# Patient Record
Sex: Female | Born: 2000 | Race: White | Hispanic: No | Marital: Single | Smoking: Never smoker
Health system: Southern US, Community
[De-identification: ages and names within clinical notes are randomized; demographics above are authoritative.]

## PROBLEM LIST (undated history)

## (undated) DIAGNOSIS — K219 Gastro-esophageal reflux disease without esophagitis: Secondary | ICD-10-CM

## (undated) DIAGNOSIS — R519 Headache, unspecified: Secondary | ICD-10-CM

## (undated) HISTORY — PX: NO PAST SURGERIES: SHX2092

---
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
## 2020-01-05 ENCOUNTER — Other Ambulatory Visit: Payer: Medicaid Other

## 2020-01-12 ENCOUNTER — Ambulatory Visit (INDEPENDENT_AMBULATORY_CARE_PROVIDER_SITE_OTHER): Payer: Medicaid Other

## 2020-01-12 ENCOUNTER — Other Ambulatory Visit: Payer: Self-pay

## 2020-01-12 ENCOUNTER — Encounter: Payer: Self-pay | Admitting: Obstetrics & Gynecology

## 2020-01-12 ENCOUNTER — Ambulatory Visit (INDEPENDENT_AMBULATORY_CARE_PROVIDER_SITE_OTHER): Payer: Medicaid Other | Admitting: Obstetrics & Gynecology

## 2020-01-12 VITALS — BP 100/60 | Wt 158.0 lb

## 2020-01-12 DIAGNOSIS — Z3A21 21 weeks gestation of pregnancy: Secondary | ICD-10-CM

## 2020-01-12 DIAGNOSIS — Z3482 Encounter for supervision of other normal pregnancy, second trimester: Secondary | ICD-10-CM

## 2020-01-12 DIAGNOSIS — Z131 Encounter for screening for diabetes mellitus: Secondary | ICD-10-CM

## 2020-01-12 DIAGNOSIS — Z3A20 20 weeks gestation of pregnancy: Secondary | ICD-10-CM

## 2020-01-12 DIAGNOSIS — Z3480 Encounter for supervision of other normal pregnancy, unspecified trimester: Secondary | ICD-10-CM

## 2020-01-12 NOTE — Patient Instructions (Signed)
Thank you for choosing [facility]. As part of our ongoing efforts to improve patient experience, we would appreciate your feedback. Please fill out the short survey that you will receive by mail or MyChart. Your opinion is important to us! -Dr Felicite Zeimet  Second Trimester of Pregnancy The second trimester is from week 14 through week 27 (months 4 through 6). The second trimester is often a time when you feel your best. Your body has adjusted to being pregnant, and you begin to feel better physically. Usually, morning sickness has lessened or quit completely, you may have more energy, and you may have an increase in appetite. The second trimester is also a time when the fetus is growing rapidly. At the end of the sixth month, the fetus is about 9 inches long and weighs about 1 pounds. You will likely begin to feel the baby move (quickening) between 16 and 20 weeks of pregnancy. Body changes during your second trimester Your body continues to go through many changes during your second trimester. The changes vary from woman to woman.  Your weight will continue to increase. You will notice your lower abdomen bulging out.  You may begin to get stretch marks on your hips, abdomen, and breasts.  You may develop headaches that can be relieved by medicines. The medicines should be approved by your health care provider.  You may urinate more often because the fetus is pressing on your bladder.  You may develop or continue to have heartburn as a result of your pregnancy.  You may develop constipation because certain hormones are causing the muscles that push waste through your intestines to slow down.  You may develop hemorrhoids or swollen, bulging veins (varicose veins).  You may have back pain. This is caused by: ? Weight gain. ? Pregnancy hormones that are relaxing the joints in your pelvis. ? A shift in weight and the muscles that support your balance.  Your breasts will continue to grow and  they will continue to become tender.  Your gums may bleed and may be sensitive to brushing and flossing.  Dark spots or blotches (chloasma, mask of pregnancy) may develop on your face. This will likely fade after the baby is born.  A dark line from your belly button to the pubic area (linea nigra) may appear. This will likely fade after the baby is born.  You may have changes in your hair. These can include thickening of your hair, rapid growth, and changes in texture. Some women also have hair loss during or after pregnancy, or hair that feels dry or thin. Your hair will most likely return to normal after your baby is born. What to expect at prenatal visits During a routine prenatal visit:  You will be weighed to make sure you and the fetus are growing normally.  Your blood pressure will be taken.  Your abdomen will be measured to track your baby's growth.  The fetal heartbeat will be listened to.  Any test results from the previous visit will be discussed. Your health care provider may ask you:  How you are feeling.  If you are feeling the baby move.  If you have had any abnormal symptoms, such as leaking fluid, bleeding, severe headaches, or abdominal cramping.  If you are using any tobacco products, including cigarettes, chewing tobacco, and electronic cigarettes.  If you have any questions. Other tests that may be performed during your second trimester include:  Blood tests that check for: ? Low iron levels (  anemia). ? High blood sugar that affects pregnant women (gestational diabetes) between 24 and 28 weeks. ? Rh antibodies. This is to check for a protein on red blood cells (Rh factor).  Urine tests to check for infections, diabetes, or protein in the urine.  An ultrasound to confirm the proper growth and development of the baby.  An amniocentesis to check for possible genetic problems.  Fetal screens for spina bifida and Down syndrome.  HIV (human  immunodeficiency virus) testing. Routine prenatal testing includes screening for HIV, unless you choose not to have this test. Follow these instructions at home: Medicines  Follow your health care provider's instructions regarding medicine use. Specific medicines may be either safe or unsafe to take during pregnancy.  Take a prenatal vitamin that contains at least 600 micrograms (mcg) of folic acid.  If you develop constipation, try taking a stool softener if your health care provider approves. Eating and drinking   Eat a balanced diet that includes fresh fruits and vegetables, whole grains, good sources of protein such as meat, eggs, or tofu, and low-fat dairy. Your health care provider will help you determine the amount of weight gain that is right for you.  Avoid raw meat and uncooked cheese. These carry germs that can cause birth defects in the baby.  If you have low calcium intake from food, talk to your health care provider about whether you should take a daily calcium supplement.  Limit foods that are high in fat and processed sugars, such as fried and sweet foods.  To prevent constipation: ? Drink enough fluid to keep your urine clear or pale yellow. ? Eat foods that are high in fiber, such as fresh fruits and vegetables, whole grains, and beans. Activity  Exercise only as directed by your health care provider. Most women can continue their usual exercise routine during pregnancy. Try to exercise for 30 minutes at least 5 days a week. Stop exercising if you experience uterine contractions.  Avoid heavy lifting, wear low heel shoes, and practice good posture.  A sexual relationship may be continued unless your health care provider directs you otherwise. Relieving pain and discomfort  Wear a good support bra to prevent discomfort from breast tenderness.  Take warm sitz baths to soothe any pain or discomfort caused by hemorrhoids. Use hemorrhoid cream if your health care  provider approves.  Rest with your legs elevated if you have leg cramps or low back pain.  If you develop varicose veins, wear support hose. Elevate your feet for 15 minutes, 3-4 times a day. Limit salt in your diet. Prenatal Care  Write down your questions. Take them to your prenatal visits.  Keep all your prenatal visits as told by your health care provider. This is important. Safety  Wear your seat belt at all times when driving.  Make a list of emergency phone numbers, including numbers for family, friends, the hospital, and police and fire departments. General instructions  Ask your health care provider for a referral to a local prenatal education class. Begin classes no later than the beginning of month 6 of your pregnancy.  Ask for help if you have counseling or nutritional needs during pregnancy. Your health care provider can offer advice or refer you to specialists for help with various needs.  Do not use hot tubs, steam rooms, or saunas.  Do not douche or use tampons or scented sanitary pads.  Do not cross your legs for long periods of time.  Avoid cat litter   boxes and soil used by cats. These carry germs that can cause birth defects in the baby and possibly loss of the fetus by miscarriage or stillbirth.  Avoid all smoking, herbs, alcohol, and unprescribed drugs. Chemicals in these products can affect the formation and growth of the baby.  Do not use any products that contain nicotine or tobacco, such as cigarettes and e-cigarettes. If you need help quitting, ask your health care provider.  Visit your dentist if you have not gone yet during your pregnancy. Use a soft toothbrush to brush your teeth and be gentle when you floss. Contact a health care provider if:  You have dizziness.  You have mild pelvic cramps, pelvic pressure, or nagging pain in the abdominal area.  You have persistent nausea, vomiting, or diarrhea.  You have a bad smelling vaginal  discharge.  You have pain when you urinate. Get help right away if:  You have a fever.  You are leaking fluid from your vagina.  You have spotting or bleeding from your vagina.  You have severe abdominal cramping or pain.  You have rapid weight gain or weight loss.  You have shortness of breath with chest pain.  You notice sudden or extreme swelling of your face, hands, ankles, feet, or legs.  You have not felt your baby move in over an hour.  You have severe headaches that do not go away when you take medicine.  You have vision changes. Summary  The second trimester is from week 14 through week 27 (months 4 through 6). It is also a time when the fetus is growing rapidly.  Your body goes through many changes during pregnancy. The changes vary from woman to woman.  Avoid all smoking, herbs, alcohol, and unprescribed drugs. These chemicals affect the formation and growth your baby.  Do not use any tobacco products, such as cigarettes, chewing tobacco, and e-cigarettes. If you need help quitting, ask your health care provider.  Contact your health care provider if you have any questions. Keep all prenatal visits as told by your health care provider. This is important. This information is not intended to replace advice given to you by your health care provider. Make sure you discuss any questions you have with your health care provider. Document Revised: 07/05/2018 Document Reviewed: 04/18/2016 [employer]vier Patient Education  2020 [employer]  

## 2020-01-12 NOTE — Progress Notes (Signed)
  Subjective  Fetal Movement? yes Contractions? no Leaking Fluid? no Vaginal Bleeding? no  Objective  BP 100/60   Wt 158 lb (71.7 kg)   LMP 08/20/2019   BMI 23.33 kg/m  General: NAD Pumonary: no increased work of breathing Abdomen: gravid, non-tender Extremities: no edema Psychiatric: mood appropriate, affect full  Review of ULTRASOUND. I have personally reviewed images and report of recent ultrasound done at [facility]. There is a singleton gestation with subjectively normal amniotic fluid volume. The fetal biometry correlates with established dating. Detailed evaluation of the fetal anatomy was performed.The fetal anatomical survey appears within normal limits within the resolution of ultrasound as described above.  It must be noted that a normal ultrasound is unable to rule out fetal aneuploidy.     Assessment  19 y.o. G1P0 at [redacted]w[redacted]d by  05/26/2020, by Last Menstrual Period presenting for routine prenatal visit  Plan   Problem List Items Addressed This Visit      Other   Supervision of other normal pregnancy, antepartum    Other Visit Diagnoses    [redacted] weeks gestation of pregnancy    -  Primary   Screening for diabetes mellitus       Relevant Orders   28 Week RH+Panel      pregnancy 1 Problems (from 11/03/19 to present)    Problem Noted Resolved   Supervision of other normal pregnancy, antepartum 10/16/2019 by Tresea Mall, CNM No   Overview Addendum 11/24/2019  8:20 AM by Natale Milch, MD    [facility] Prenatal Labs  Dating  LMP = 10 wk [place] Blood type:   O+  Genetic Screen NIPS: XY  Inheritest negative Antibody: negative  Anatomic [place]  Rubella: Immune Varicella:NI  GTT Early: NA               Third trimester:  RPR:   non Reactive  Rhogam  not needed HBsAg: negative  Vaccines TDAP:                       Flu Shot: HIV: negative  Baby Food Breast                    GBS:   GC/CT:  Contraception  Pap:NA (age)  CBB  no   CS/VBAC N/A   Support Person  Husband          Previous Version     PNV  Annamarie Major, MD, FACOG [facility], [facility] 01/12/2020  11:37 AM

## 2020-01-22 ENCOUNTER — Ambulatory Visit (INDEPENDENT_AMBULATORY_CARE_PROVIDER_SITE_OTHER): Payer: Medicaid Other

## 2020-01-22 ENCOUNTER — Other Ambulatory Visit: Payer: Self-pay

## 2020-01-22 DIAGNOSIS — R55 Syncope and collapse: Secondary | ICD-10-CM

## 2020-01-22 LAB — ECHOCARDIOGRAM COMPLETE
AR max vel: 2.96 cm2
AV Area VTI: 2.99 cm2
AV Area mean vel: 2.88 cm2
AV Mean grad: 3 mmHg
AV Peak grad: 5.6 mmHg
Ao pk vel: 1.18 m/s
Area-P 1/2: 3.4 cm2
S' Lateral: 3 cm
Single Plane A4C EF: 54.9 %

## 2020-01-26 ENCOUNTER — Ambulatory Visit: Payer: Medicaid Other | Admitting: Cardiology

## 2020-01-26 ENCOUNTER — Telehealth: Payer: Self-pay

## 2020-01-26 NOTE — Telephone Encounter (Signed)
Pt calling about labs ordered for [facility].  [phone]  [facility].

## 2020-01-26 NOTE — Telephone Encounter (Signed)
Questions answered.  Future labs to be drawn here in the office.

## 2020-02-09 ENCOUNTER — Other Ambulatory Visit: Payer: Self-pay

## 2020-02-09 ENCOUNTER — Ambulatory Visit (INDEPENDENT_AMBULATORY_CARE_PROVIDER_SITE_OTHER): Payer: Medicaid Other | Admitting: Obstetrics

## 2020-02-09 VITALS — BP 100/70 | Wt 162.0 lb

## 2020-02-09 DIAGNOSIS — Z3A24 24 weeks gestation of pregnancy: Secondary | ICD-10-CM

## 2020-02-09 DIAGNOSIS — G44329 Chronic post-traumatic headache, not intractable: Secondary | ICD-10-CM

## 2020-02-09 DIAGNOSIS — Z3482 Encounter for supervision of other normal pregnancy, second trimester: Secondary | ICD-10-CM

## 2020-02-09 LAB — POCT URINALYSIS DIPSTICK OB
Glucose, UA: NEGATIVE
POC,PROTEIN,UA: NEGATIVE

## 2020-02-09 MED ORDER — BUTALBITAL-APAP-CAFFEINE 50-325-40 MG PO CAPS: 1.0000 | ORAL_CAPSULE | Freq: Four times a day (QID) | ORAL | 0 refills | Status: DC | PRN
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
## 2020-02-12 ENCOUNTER — Encounter: Payer: Self-pay | Admitting: Cardiology

## 2020-02-12 ENCOUNTER — Ambulatory Visit (INDEPENDENT_AMBULATORY_CARE_PROVIDER_SITE_OTHER): Payer: Medicaid Other | Admitting: Cardiology

## 2020-02-12 ENCOUNTER — Other Ambulatory Visit: Payer: Self-pay

## 2020-02-12 VITALS — BP 110/70 | HR 75 | Ht 69.0 in | Wt 163.0 lb

## 2020-02-12 DIAGNOSIS — R55 Syncope and collapse: Secondary | ICD-10-CM | POA: Diagnosis not present

## 2020-02-12 DIAGNOSIS — Z3490 Encounter for supervision of normal pregnancy, unspecified, unspecified trimester: Secondary | ICD-10-CM | POA: Diagnosis not present

## 2020-02-12 NOTE — Progress Notes (Signed)
Cardiology Office Note:    Date:  02/12/2020   ID:  Valerie Conner, DOB 09/12/2000, MRN [id number]  PCP:  Erick Colace, MD  [facility] Cardiologist:  Debbe Odea, MD  [facility] Electrophysiologist:  None   Referring MD: Erick Colace, MD   Chief Complaint  Patient presents with  . OTHER    F/u echo and zio no complaints today. Meds reviewed verbally with pt.    History of Present Illness:    Valerie Conner is a 19 y.o. female with no significant past medical history, currently [redacted] weeks pregnant who presents for follow-up.  She was last seen due to syncope.  States passing out typically during her menses ongoing for 3 years.  Tach monitor was placed to evaluate any significant arrhythmias, echocardiogram ordered to evaluate any significant structural abnormalities, especially in light of patient being pregnant.  Feels well today, denies chest pain, palpitations, shortness of breath.  Has no new concerns at this time.    Past Medical History:  Diagnosis Date  . GERD (gastroesophageal reflux disease)     History reviewed. No pertinent surgical history.  Current Medications: Current Meds  Medication Sig  . Butalbital-APAP-Caffeine 50-325-40 MG capsule Take 1 capsule by mouth every 6 (six) hours as needed for headache.  . Prenatal Vit-Fe Fumarate-FA (PRENATAL VITAMIN PO) Take by mouth.     Allergies:   Patient has no known allergies.   Social History   Socioeconomic History  . Marital status: Single    Spouse name: Not on file  . Number of children: Not on file  . Years of education: Not on file  . Highest education level: Not on file  Occupational History  . Not on file  Tobacco Use  . Smoking status: Never Smoker  . Smokeless tobacco: Never Used  Substance and Sexual Activity  . Alcohol use: No  . Drug use: Never  . Sexual activity: Yes    Birth control/protection: None  Other Topics Concern  . Not on file  Social History Narrative  . Not  on file   Social Determinants of Health   Financial Resource Strain:   . Difficulty of Paying Living Expenses: Not on file  Food Insecurity:   . Worried About Programme researcher, broadcasting/film/video in the Last Year: Not on file  . Ran Out of Food in the Last Year: Not on file  Transportation Needs:   . Lack of Transportation (Medical): Not on file  . Lack of Transportation (Non-Medical): Not on file  Physical Activity:   . Days of Exercise per Week: Not on file  . Minutes of Exercise per Session: Not on file  Stress:   . Feeling of Stress : Not on file  Social Connections:   . Frequency of Communication with Friends and Family: Not on file  . Frequency of Social Gatherings with Friends and Family: Not on file  . Attends Religious Services: Not on file  . Active Member of Clubs or Organizations: Not on file  . Attends Banker Meetings: Not on file  . Marital Status: Not on file     Family History: The patient's family history is not on file.  ROS:   Please see the history of present illness.     All other systems reviewed and are negative.  EKGs/Labs/Other Studies Reviewed:    The following studies were reviewed today:   EKG:  EKG not  ordered today.    Recent Labs: 10/27/2019: Hemoglobin 11.8; Platelets 298  11/24/2019: ALT 13; BUN 7; Creatinine, Ser 0.52; Potassium 4.3; Sodium 137  Recent Lipid Panel No results found for: CHOL, TRIG, HDL, CHOLHDL, VLDL, LDLCALC, LDLDIRECT  Physical Exam:    VS:  BP 110/70 (BP Location: Left Arm, Patient Position: Sitting, Cuff Size: Normal)   Pulse 75   Ht 5\' 9"  (1.753 m)   Wt 163 lb (73.9 kg)   LMP 08/20/2019   SpO2 99%   BMI 24.07 kg/m     Wt Readings from Last 3 Encounters:  02/12/20 163 lb (73.9 kg) (90 %, Z= 1.26)*  02/09/20 162 lb (73.5 kg) (89 %, Z= 1.24)*  01/12/20 158 lb (71.7 kg) (87 %, Z= 1.14)*   * Growth percentiles are based on CDC (Girls, 2-20 Years) data.     GEN:  Well nourished, well developed in no acute  distress HEENT: Normal NECK: No JVD; No carotid bruits LYMPHATICS: No lymphadenopathy CARDIAC: RRR, no murmurs, rubs, gallops RESPIRATORY:  Clear to auscultation without rales, wheezing or rhonchi  ABDOMEN: Soft, non-tender, non-distended MUSCULOSKELETAL:  No edema; No deformity  SKIN: Warm and dry NEUROLOGIC:  Alert and oriented x 3 PSYCHIATRIC:  Normal affect   ASSESSMENT:    1. Syncope, unspecified syncope type   2. Pregnancy, unspecified gestational age    PLAN:    In order of problems listed above:  1. Patient with symptoms consistent with vasovagal syncope, including prodromes of dizziness, nausea, blurry vision prior to passing out.  Previous orthostatic were negative for orthostasis.  Cardiac monitor with no significant arrhythmias.  Echocardiogram showed normal systolic and diastolic function.  Patient made aware of results and reassured.  Symptoms likely vasovagal. 2. Currently [redacted] weeks pregnant, continue prenatal vitamins as per her OB/GYN.  Keep appointments with her obstetrician.  Follow-up as needed  Total encounter time 35 minutes  Greater than 50% was spent in counseling and coordination of care with the patient   Medication Adjustments/Labs and Tests Ordered: Current medicines are reviewed at length with the patient today.  Concerns regarding medicines are outlined above.  Orders Placed This Encounter  Procedures  . EKG 12-Lead   No orders of the defined types were placed in this encounter.   Patient Instructions  Medication Instructions:  Your physician recommends that you continue on your current medications as directed. Please refer to the Current Medication list given to you today.  *If you need a refill on your cardiac medications before your next appointment, please call your pharmacy*   Lab Work: None Ordered If you have labs (blood work) drawn today and your tests are completely normal, you will receive your results only by: 01/14/20 MyChart Message  (if you have MyChart) OR . A paper copy in the mail If you have any lab test that is abnormal or we need to change your treatment, we will call you to review the results.   Testing/Procedures: None Ordered   Follow-Up: At [facility], you and your health needs are our priority.  As part of our continuing mission to provide you with exceptional heart care, we have created designated Provider Care Teams.  These Care Teams include your primary Cardiologist (physician) and Advanced Practice Providers (APPs -  Physician Assistants and Nurse Practitioners) who all work together to provide you with the care you need, when you need it.  We recommend signing up for the patient portal called "MyChart".  Sign up information is provided on this After Visit Summary.  MyChart is used to connect with patients for  Virtual Visits (Telemedicine).  Patients are able to view lab/test results, encounter notes, upcoming appointments, etc.  Non-urgent messages can be sent to your provider as well.   To learn more about what you can do with MyChart, go to [email or web address].    Your next appointment:   Follow up as needed    The format for your next appointment:   In Person  Provider:   Debbe Odea, MD   Other Instructions      Signed, Debbe Odea, MD  02/12/2020 12:48 PM    [facility]

## 2020-02-12 NOTE — Patient Instructions (Signed)

## 2020-02-18 ENCOUNTER — Ambulatory Visit: Payer: Medicaid Other | Admitting: Neurology

## 2020-02-18 ENCOUNTER — Encounter: Payer: Self-pay | Admitting: Neurology

## 2020-02-18 ENCOUNTER — Other Ambulatory Visit: Payer: Self-pay

## 2020-02-18 VITALS — BP 98/64 | HR 72 | Ht 69.0 in | Wt 163.0 lb

## 2020-02-18 DIAGNOSIS — R55 Syncope and collapse: Secondary | ICD-10-CM | POA: Diagnosis not present
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
## 2020-02-18 NOTE — Patient Instructions (Signed)
Good to meet you. Try the counterpressure maneuvers when you start feeling the warning symptoms. Increase water intake and avoid triggers. Follow-up as needed, call for any changes

## 2020-02-21 ENCOUNTER — Encounter: Payer: Self-pay | Admitting: Neurology

## 2020-03-08 ENCOUNTER — Ambulatory Visit (INDEPENDENT_AMBULATORY_CARE_PROVIDER_SITE_OTHER): Payer: Medicaid Other | Admitting: Obstetrics & Gynecology

## 2020-03-08 ENCOUNTER — Other Ambulatory Visit: Payer: Self-pay

## 2020-03-08 ENCOUNTER — Encounter: Payer: Self-pay | Admitting: Obstetrics & Gynecology

## 2020-03-08 ENCOUNTER — Other Ambulatory Visit: Payer: Medicaid Other

## 2020-03-08 VITALS — BP 100/70 | Wt 167.0 lb

## 2020-03-08 DIAGNOSIS — Z3482 Encounter for supervision of other normal pregnancy, second trimester: Secondary | ICD-10-CM

## 2020-03-08 DIAGNOSIS — Z3A28 28 weeks gestation of pregnancy: Secondary | ICD-10-CM

## 2020-03-08 DIAGNOSIS — Z3483 Encounter for supervision of other normal pregnancy, third trimester: Secondary | ICD-10-CM

## 2020-03-08 DIAGNOSIS — Z3A24 24 weeks gestation of pregnancy: Secondary | ICD-10-CM

## 2020-03-08 NOTE — Progress Notes (Signed)
  Subjective  Fetal Movement? yes Contractions? no Leaking Fluid? no Vaginal Bleeding? No No syncope  Objective  BP 100/70   Wt 167 lb (75.8 kg)   LMP 08/20/2019   BMI 24.66 kg/m  General: NAD Pumonary: no increased work of breathing Abdomen: gravid, non-tender Extremities: no edema Psychiatric: mood appropriate, affect full  Assessment  19 y.o. G1P0 at [redacted]w[redacted]d by  05/26/2020, by Last Menstrual Period presenting for routine prenatal visit  Plan    Problem List Items Addressed This Visit    Visit Diagnoses    [redacted] weeks gestation of pregnancy    -  Primary   Encounter for supervision of other normal pregnancy in third trimester          pregnancy 1 Problems (from 11/03/19 to present)    Problem Noted Resolved   Supervision of other normal pregnancy, antepartum 10/16/2019 by Tresea Mall, CNM No   Overview Addendum 11/24/2019  8:20 AM by Natale Milch, MD    [facility]inic [facility] Prenatal [facility]  Dating  LMP = 10 wk [place] Blood type:   O+  Genetic Screen NIPS: XY  Inheritest negative Antibody: negative  Anatomic [place]  Rubella: Immune Varicella:NI  GTT Early: NA               Third trimester:  RPR:   non Reactive  Rhogam  not needed HBsAg: negative  Vaccines TDAP:                       Flu Shot: HIV: negative  Baby Food unsure                       GBS:   GC/CT:  Contraception  Pap:NA (age)  CBB     CS/VBAC N/A   Support Person        PNV, FMC, PTL precautions discussed     Annamarie Major, MD, [facility], [facility] 03/08/2020  10:21 AM

## 2020-03-08 NOTE — Patient Instructions (Signed)
Third Trimester of Pregnancy The third trimester is from week 28 through week 40 (months 7 through 9). The third trimester is a time when the unborn baby (fetus) is growing rapidly. At the end of the ninth month, the fetus is about 20 inches in length and weighs 6-10 pounds. Body changes during your third trimester Your body will continue to go through many changes during pregnancy. The changes vary from woman to woman. During the third trimester:  Your weight will continue to increase. You can expect to gain 25-35 pounds (11-16 kg) by the end of the pregnancy.  You may begin to get stretch marks on your hips, abdomen, and breasts.  You may urinate more often because the fetus is moving lower into your pelvis and pressing on your bladder.  You may develop or continue to have heartburn. This is caused by increased hormones that slow down muscles in the digestive tract.  You may develop or continue to have constipation because increased hormones slow digestion and cause the muscles that push waste through your intestines to relax.  You may develop hemorrhoids. These are swollen veins (varicose veins) in the rectum that can itch or be painful.  You may develop swollen, bulging veins (varicose veins) in your legs.  You may have increased body aches in the pelvis, back, or thighs. This is due to weight gain and increased hormones that are relaxing your joints.  You may have changes in your hair. These can include thickening of your hair, rapid growth, and changes in texture. Some women also have hair loss during or after pregnancy, or hair that feels dry or thin. Your hair will most likely return to normal after your baby is born.  Your breasts will continue to grow and they will continue to become tender. A yellow fluid (colostrum) may leak from your breasts. This is the first milk you are producing for your baby.  Your belly button may stick out.  You may notice more swelling in your hands,  face, or ankles.  You may have increased tingling or numbness in your hands, arms, and legs. The skin on your belly may also feel numb.  You may feel short of breath because of your expanding uterus.  You may have more problems sleeping. This can be caused by the size of your belly, increased need to urinate, and an increase in your body's metabolism.  You may notice the fetus "dropping," or moving lower in your abdomen (lightening).  You may have increased vaginal discharge.  You may notice your joints feel loose and you may have pain around your pelvic bone. What to expect at prenatal visits You will have prenatal exams every 2 weeks until week 36. Then you will have weekly prenatal exams. During a routine prenatal visit:  You will be weighed to make sure you and the baby are growing normally.  Your blood pressure will be taken.  Your abdomen will be measured to track your baby's growth.  The fetal heartbeat will be listened to.  Any test results from the previous visit will be discussed.  You may have a cervical check near your due date to see if your cervix has softened or thinned (effaced).  You will be tested for Group B streptococcus. This happens between 35 and 37 weeks. Your health care provider may ask you:  What your birth plan is.  How you are feeling.  If you are feeling the baby move.  If you have had any abnormal   symptoms, such as leaking fluid, bleeding, severe headaches, or abdominal cramping.  If you are using any tobacco products, including cigarettes, chewing tobacco, and electronic cigarettes.  If you have any questions. Other tests or screenings that may be performed during your third trimester include:  Blood tests that check for low iron levels (anemia).  Fetal testing to check the health, activity level, and growth of the fetus. Testing is done if you have certain medical conditions or if there are problems during the pregnancy.  Nonstress test  (NST). This test checks the health of your baby to make sure there are no signs of problems, such as the baby not getting enough oxygen. During this test, a belt is placed around your belly. The baby is made to move, and its heart rate is monitored during movement. What is false labor? False labor is a condition in which you feel small, irregular tightenings of the muscles in the womb (contractions) that usually go away with rest, changing position, or drinking water. These are called Braxton Hicks contractions. Contractions may last for hours, days, or even weeks before true labor sets in. If contractions come at regular intervals, become more frequent, increase in intensity, or become painful, you should see your health care provider. What are the signs of labor?  Abdominal cramps.  Regular contractions that start at 10 minutes apart and become stronger and more frequent with time.  Contractions that start on the top of the uterus and spread down to the lower abdomen and back.  Increased pelvic pressure and dull back pain.  A watery or bloody mucus discharge that comes from the vagina.  Leaking of amniotic fluid. This is also known as your "water breaking." It could be a slow trickle or a gush. Let your health care provider know if it has a color or strange odor. If you have any of these signs, call your health care provider right away, even if it is before your due date. Follow these instructions at home: Medicines  Follow your health care provider's instructions regarding medicine use. Specific medicines may be either safe or unsafe to take during pregnancy.  Take a prenatal vitamin that contains at least 600 micrograms (mcg) of folic acid.  If you develop constipation, try taking a stool softener if your health care provider approves. Eating and drinking   Eat a balanced diet that includes fresh fruits and vegetables, whole grains, good sources of protein such as meat, eggs, or tofu,  and low-fat dairy. Your health care provider will help you determine the amount of weight gain that is right for you.  Avoid raw meat and uncooked cheese. These carry germs that can cause birth defects in the baby.  If you have low calcium intake from food, talk to your health care provider about whether you should take a daily calcium supplement.  Eat four or five small meals rather than three large meals a day.  Limit foods that are high in fat and processed sugars, such as fried and sweet foods.  To prevent constipation: ? Drink enough fluid to keep your urine clear or pale yellow. ? Eat foods that are high in fiber, such as fresh fruits and vegetables, whole grains, and beans. Activity  Exercise only as directed by your health care provider. Most women can continue their usual exercise routine during pregnancy. Try to exercise for 30 minutes at least 5 days a week. Stop exercising if you experience uterine contractions.  Avoid heavy lifting.  Do   not exercise in extreme heat or humidity, or at high altitudes.  Wear low-heel, comfortable shoes.  Practice good posture.  You may continue to have sex unless your health care provider tells you otherwise. Relieving pain and discomfort  Take frequent breaks and rest with your legs elevated if you have leg cramps or low back pain.  Take warm sitz baths to soothe any pain or discomfort caused by hemorrhoids. Use hemorrhoid cream if your health care provider approves.  Wear a good support bra to prevent discomfort from breast tenderness.  If you develop varicose veins: ? Wear support pantyhose or compression stockings as told by your healthcare provider. ? Elevate your feet for 15 minutes, 3-4 times a day. Prenatal care  Write down your questions. Take them to your prenatal visits.  Keep all your prenatal visits as told by your health care provider. This is important. Safety  Wear your seat belt at all times when driving.  Make  a list of emergency phone numbers, including numbers for family, friends, the hospital, and police and fire departments. General instructions  Avoid cat litter boxes and soil used by cats. These carry germs that can cause birth defects in the baby. If you have a cat, ask someone to clean the litter box for you.  Do not travel far distances unless it is absolutely necessary and only with the approval of your health care provider.  Do not use hot tubs, steam rooms, or saunas.  Do not drink alcohol.  Do not use any products that contain nicotine or tobacco, such as cigarettes and e-cigarettes. If you need help quitting, ask your health care provider.  Do not use any medicinal herbs or unprescribed drugs. These chemicals affect the formation and growth of the baby.  Do not douche or use tampons or scented sanitary pads.  Do not cross your legs for long periods of time.  To prepare for the arrival of your baby: ? Take prenatal classes to understand, practice, and ask questions about labor and delivery. ? Make a trial run to the hospital. ? Visit the hospital and tour the maternity area. ? Arrange for maternity or paternity leave through employers. ? Arrange for family and friends to take care of pets while you are in the hospital. ? Purchase a rear-facing car seat and make sure you know how to install it in your car. ? Pack your hospital bag. ? Prepare the baby's nursery. Make sure to remove all pillows and stuffed animals from the baby's crib to prevent suffocation.  Visit your dentist if you have not gone during your pregnancy. Use a soft toothbrush to brush your teeth and be gentle when you floss. Contact a health care provider if:  You are unsure if you are in labor or if your water has broken.  You become dizzy.  You have mild pelvic cramps, pelvic pressure, or nagging pain in your abdominal area.  You have lower back pain.  You have persistent nausea, vomiting, or  diarrhea.  You have an unusual or bad smelling vaginal discharge.  You have pain when you urinate. Get help right away if:  Your water breaks before 37 weeks.  You have regular contractions less than 5 minutes apart before 37 weeks.  You have a fever.  You are leaking fluid from your vagina.  You have spotting or bleeding from your vagina.  You have severe abdominal pain or cramping.  You have rapid weight loss or weight gain.  You have   shortness of breath with chest pain.  You notice sudden or extreme swelling of your face, hands, ankles, feet, or legs.  Your baby makes fewer than 10 movements in 2 hours.  You have severe headaches that do not go away when you take medicine.  You have vision changes. Summary  The third trimester is from week 28 through week 40, months 7 through 9. The third trimester is a time when the unborn baby (fetus) is growing rapidly.  During the third trimester, your discomfort may increase as you and your baby continue to gain weight. You may have abdominal, leg, and back pain, sleeping problems, and an increased need to urinate.  During the third trimester your breasts will keep growing and they will continue to become tender. A yellow fluid (colostrum) may leak from your breasts. This is the first milk you are producing for your baby.  False labor is a condition in which you feel small, irregular tightenings of the muscles in the womb (contractions) that eventually go away. These are called Braxton Hicks contractions. Contractions may last for hours, days, or even weeks before true labor sets in.  Signs of labor can include: abdominal cramps; regular contractions that start at 10 minutes apart and become stronger and more frequent with time; watery or bloody mucus discharge that comes from the vagina; increased pelvic pressure and dull back pain; and leaking of amniotic fluid. This information is not intended to replace advice given to you by your  health care provider. Make sure you discuss any questions you have with your health care provider. Document Revised: 07/04/2018 Document Reviewed: 04/18/2016 Elsevier Patient Education  2020 [employer]  

## 2020-03-09 LAB — GLUCOSE, 1 HOUR GESTATIONAL: Gestational Diabetes Screen: 121 mg/dL (ref 65–139)

## 2020-03-22 ENCOUNTER — Ambulatory Visit (INDEPENDENT_AMBULATORY_CARE_PROVIDER_SITE_OTHER): Payer: Medicaid Other | Admitting: Obstetrics

## 2020-03-22 ENCOUNTER — Other Ambulatory Visit: Payer: Self-pay

## 2020-03-22 VITALS — BP 100/70 | Wt 169.0 lb

## 2020-03-22 DIAGNOSIS — Z3A30 30 weeks gestation of pregnancy: Secondary | ICD-10-CM

## 2020-03-22 DIAGNOSIS — Z3483 Encounter for supervision of other normal pregnancy, third trimester: Secondary | ICD-10-CM

## 2020-03-22 NOTE — Progress Notes (Signed)
  Routine Prenatal Care Visit  Subjective  Valerie Conner is a 19 y.o. G1P0 at [redacted]w[redacted]d being seen today for ongoing prenatal care.  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  ----------------------------------------------------------------------------------- Patient reports no complaints.  She saw a Neuro MD for her headaches and episodes of feelig faint. Has a dx, but not treatment. The fioricet has really helped her headaches. She knows to take this sparingly.  .  .   Pincus Large Fluid denies.  ----------------------------------------------------------------------------------- The following portions of the patient's history were reviewed and updated as appropriate: allergies, current medications, past family history, past medical history, past social history, past surgical history and problem list. Problem list updated.  Objective  Blood pressure 100/70, weight 169 lb (76.7 kg), last menstrual period 08/20/2019, unknown if currently breastfeeding. Pregravid weight 148 lb (67.1 kg) Total Weight Gain 21 lb (9.526 kg) Urinalysis: Urine Protein    Urine Glucose    Fetal Status:           General:  Alert, oriented and cooperative. Patient is in no acute distress.  Skin: Skin is warm and dry. No rash noted.   Cardiovascular: Normal heart rate noted  Respiratory: Normal respiratory effort, no problems with respiration noted  Abdomen: Soft, gravid, appropriate for gestational age.       Pelvic:  Cervical exam deferred        Extremities: Normal range of motion.     Mental Status: Normal mood and affect. Normal behavior. Normal judgment and thought content.   Assessment   19 y.o. G1P0 at [redacted]w[redacted]d by  05/26/2020, by Last Menstrual Period presenting for routine prenatal visit  Plan   pregnancy 1 Problems (from 11/03/19 to present)    Problem Noted  Resolved   Supervision of other normal pregnancy, antepartum 10/16/2019 by Tresea Mall, CNM No   Overview Addendum 03/10/2020  5:29 PM by Mirna Mires, CNM    [facility]inic [facility] Prenatal Labs  Dating  LMP = 10 wk [place] Blood type:   O+  Genetic Screen NIPS: XY  Inheritest negative Antibody: negative  Anatomic [place] Normal female Rubella: Immune Varicella:NI  GTT Early: NA               Third trimester: WNL RPR:   non Reactive  Rhogam  not needed HBsAg: negative  Vaccines TDAP:                       Flu Shot: HIV: negative  Baby Food Breast                     GBS:   GC/CT:  Contraception  Pap:NA (age)  CBB     CS/VBAC N/A   Support Person           Previous Version       Preterm labor symptoms and general obstetric precautions including but not limited to vaginal bleeding, contractions, leaking of fluid and fetal movement were reviewed in detail with the patient. Please refer to After Visit Summary for other counseling recommendations.   Return in about 2 weeks (around 04/05/2020) for return OB.  Mirna Mires, CNM  03/22/2020 10:00 AM

## 2020-03-22 NOTE — Progress Notes (Signed)
No concerns.rj 

## 2020-03-27 NOTE — L&D Delivery Note (Signed)
Delivery Note At 0504, a viable female was delivered  vaginally through bilateral labial lacerations and over an intact perineum. Infant presented ROA, with restitution to ROT. Nuch cord x 1 reduced on the perinuem. Anterior should delivered with gentle downward guidance and the posterior should followed easily.The baby was then placed skin to skin on the maternal abdomen for drying and bonding. Sponaneous respirations noted (Presentation:    OA  ).  APGAR:7 9, ; weight  pending.   Placenta status:  intact, delivered at 0516,  .  Cord:3 vessel   with the following complications:none. A meconium plug delivered with the baby  .    Anesthesia:  Epidiural Episiotomy:  none Lacerations:  bilateral labial Suture Repair: 3.0 vicryl Est. Blood Loss (mL):  380  Mom to postpartum.  Baby to Couplet care / Skin to Skin.  Mirna Mires 06/03/2020, 5:34 AM

## 2020-04-05 ENCOUNTER — Other Ambulatory Visit: Payer: Self-pay

## 2020-04-05 ENCOUNTER — Ambulatory Visit (INDEPENDENT_AMBULATORY_CARE_PROVIDER_SITE_OTHER): Payer: Medicaid Other | Admitting: Obstetrics

## 2020-04-05 VITALS — BP 98/70 | Wt 172.0 lb

## 2020-04-05 DIAGNOSIS — Z3A32 32 weeks gestation of pregnancy: Secondary | ICD-10-CM

## 2020-04-05 DIAGNOSIS — Z3480 Encounter for supervision of other normal pregnancy, unspecified trimester: Secondary | ICD-10-CM

## 2020-04-05 LAB — POCT URINALYSIS DIPSTICK OB: Glucose, UA: NEGATIVE

## 2020-04-05 NOTE — Progress Notes (Signed)
  Routine Prenatal Care Visit  Subjective  Valerie Conner is a 20 y.o. G1P0 at [redacted]w[redacted]d being seen today for ongoing prenatal care.  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  ----------------------------------------------------------------------------------- Patient reports no complaints.    .  .   Pincus Large Fluid denies.  ----------------------------------------------------------------------------------- The following portions of the patient's history were reviewed and updated as appropriate: allergies, current medications, past family history, past medical history, past social history, past surgical history and problem list. Problem list updated.  Objective  Last menstrual period 08/20/2019, unknown if currently breastfeeding. Pregravid weight 148 lb (67.1 kg) Total Weight Gain 21 lb (9.526 kg) Urinalysis: Urine Protein Small (1+)  Urine Glucose Negative  Fetal Status:           General:  Alert, oriented and cooperative. Patient is in no acute distress.  Skin: Skin is warm and dry. No rash noted.   Cardiovascular: Normal heart rate noted  Respiratory: Normal respiratory effort, no problems with respiration noted  Abdomen: Soft, gravid, appropriate for gestational age.       Pelvic:  Cervical exam deferred        Extremities: Normal range of motion.     Mental Status: Normal mood and affect. Normal behavior. Normal judgment and thought content.   Assessment   20 y.o. G1P0 at [redacted]w[redacted]d by  05/26/2020, by Last Menstrual Period presenting for routine prenatal visit  Plan   pregnancy 1 Problems (from 11/03/19 to present)    Problem Noted Resolved   Supervision of other normal pregnancy, antepartum 10/16/2019 by Tresea Mall, CNM No   Overview Addendum 03/10/2020  5:29 PM by Mirna Mires, CNM    [facility] Prenatal Labs  Dating  LMP  = 10 wk [place] Blood type:   O+  Genetic Screen NIPS: XY  Inheritest negative Antibody: negative  Anatomic [place] Normal female Rubella: Immune Varicella:NI  GTT Early: NA               Third trimester: WNL RPR:   non Reactive  Rhogam  not needed HBsAg: negative  Vaccines TDAP:                       Flu Shot: HIV: negative  Baby Food Breast                     GBS:   GC/CT:  Contraception  Pap:NA (age)  CBB     CS/VBAC N/A   Support Person           Previous Version       Preterm labor symptoms and general obstetric precautions including but not limited to vaginal bleeding, contractions, leaking of fluid and fetal movement were reviewed in detail with the patient. Please refer to After Visit Summary for other counseling recommendations.   Return in about 2 weeks (around 04/19/2020) for return OB.  Mirna Mires, CNM  04/05/2020 10:09 AM

## 2020-04-05 NOTE — Progress Notes (Signed)
ROB- no concerns 

## 2020-04-19 ENCOUNTER — Encounter: Payer: Self-pay | Admitting: Obstetrics & Gynecology

## 2020-04-19 ENCOUNTER — Other Ambulatory Visit: Payer: Self-pay

## 2020-04-19 ENCOUNTER — Ambulatory Visit (INDEPENDENT_AMBULATORY_CARE_PROVIDER_SITE_OTHER): Payer: Medicaid Other | Admitting: Obstetrics & Gynecology

## 2020-04-19 VITALS — BP 100/60 | Wt 174.0 lb

## 2020-04-19 DIAGNOSIS — Z3483 Encounter for supervision of other normal pregnancy, third trimester: Secondary | ICD-10-CM

## 2020-04-19 DIAGNOSIS — Z3A34 34 weeks gestation of pregnancy: Secondary | ICD-10-CM

## 2020-04-19 LAB — POCT URINALYSIS DIPSTICK OB
Glucose, UA: NEGATIVE
POC,PROTEIN,UA: NEGATIVE

## 2020-04-19 NOTE — Addendum Note (Signed)
Addended by: Cornelius Moras D on: 04/19/2020 10:14 AM   Modules accepted: Orders

## 2020-04-19 NOTE — Progress Notes (Signed)
  Subjective  Fetal Movement? yes Contractions? no Leaking Fluid? no Vaginal Bleeding? no  Objective  BP 100/60   Wt 174 lb (78.9 kg)   LMP 08/20/2019   BMI 25.70 kg/m  General: NAD Pumonary: no increased work of breathing Abdomen: gravid, non-tender Extremities: no edema Psychiatric: mood appropriate, affect full  Assessment  20 y.o. G1P0 at [redacted]w[redacted]d by  05/26/2020, by Last Menstrual Period presenting for routine prenatal visit  Plan   Problem List Items Addressed This Visit      Other   Supervision of other normal pregnancy, antepartum    Other Visit Diagnoses    [redacted] weeks gestation of pregnancy    -  Primary      pregnancy 1 Problems (from 11/03/19 to present)    Problem Noted Resolved   Supervision of other normal pregnancy, antepartum 10/16/2019 by Tresea Mall, CNM No   Overview Addendum 04/19/2020 10:11 AM by Nadara Mustard, MD    [facility] Prenatal Labs  Dating  LMP = 10 wk [place] Blood type:   O+  Genetic Screen NIPS: XY  Inheritest negative Antibody: negative  Anatomic [place] Normal female Rubella: Immune Varicella:NI  GTT Early: NA               Third trimester: WNL RPR:   non Reactive  Rhogam  not needed HBsAg: negative  Vaccines TDAP:                       Flu Shot: HIV: negative  Baby Food Breast                     GBS:   GC/CT:  Contraception POP Pap:NA (age)  CBB  No   CS/VBAC N/A   Support Person           Previous Version     PTL precautions discussed  Breast feeding counseled to pt  Contraception options discussed, planning POP while breast feeding  Annamarie Major, MD, [facility], [facility] 04/19/2020  10:11 AM

## 2020-04-19 NOTE — Patient Instructions (Signed)
Thank you for choosing [facility]. As part of our ongoing efforts to improve patient experience, we would appreciate your feedback. Please fill out the short survey that you will receive by mail or MyChart. Your opinion is important to [place]! -Dr Tiburcio Pea   Recommendations to boost your immunity to prevent illness such as viral flu and colds, including covid19, are as follows:       - - -  Vitamin K2 and Vitamin D3  - - - Take Vitamin K2 at 200-300 mcg daily (usually 2-3 pills daily of the over the counter formulation). Take Vitamin D3 at 3000-4000 U daily (usually 3-4 pills daily of the over the counter formulation). Studies show that these two at high normal levels in your system are very effective in keeping your immunity so strong and protective that you will be unlikely to contract viral illness such as those listed above.  Dr Caroline Sauger Contractions Contractions of the uterus can occur throughout pregnancy, but they are not always a sign that you are in labor. You may have practice contractions called Braxton Hicks contractions. These false labor contractions are sometimes confused with true labor. What are Deberah Pelton contractions? Braxton Hicks contractions are tightening movements that occur in the muscles of the uterus before labor. Unlike true labor contractions, these contractions do not result in opening (dilation) and thinning of the cervix. Toward the end of pregnancy (32-34 weeks), Braxton Hicks contractions can happen more often and may become stronger. These contractions are sometimes difficult to tell apart from true labor because they can be very uncomfortable. You should not feel embarrassed if you go to the hospital with false labor. Sometimes, the only way to tell if you are in true labor is for your health care provider to look for changes in the cervix. The health care provider will do a physical exam and may monitor your contractions. If you are not in true  labor, the exam should show that your cervix is not dilating and your water has not broken. If there are no other health problems associated with your pregnancy, it is completely safe for you to be sent home with false labor. You may continue to have Braxton Hicks contractions until you go into true labor. How to tell the difference between true labor and false labor True labor  Contractions last 30-70 seconds.  Contractions become very regular.  Discomfort is usually felt in the top of the uterus, and it spreads to the lower abdomen and low back.  Contractions do not go away with walking.  Contractions usually become more intense and increase in frequency.  The cervix dilates and gets thinner. False labor  Contractions are usually shorter and not as strong as true labor contractions.  Contractions are usually irregular.  Contractions are often felt in the front of the lower abdomen and in the groin.  Contractions may go away when you walk around or change positions while lying down.  Contractions get weaker and are shorter-lasting as time goes on.  The cervix usually does not dilate or become thin. Follow these instructions at home:  Take over-the-counter and prescription medicines only as told by your health care provider.  Keep up with your usual exercises and follow other instructions from your health care provider.  Eat and drink lightly if you think you are going into labor.  If Braxton Hicks contractions are making you uncomfortable: ? Change your position from lying down or resting to walking, or change from  walking to resting. ? Sit and rest in a tub of warm water. ? Drink enough fluid to keep your urine pale yellow. Dehydration may cause these contractions. ? Do slow and deep breathing several times an hour.  Keep all follow-up prenatal visits as told by your health care provider. This is important.   Contact a health care provider if:  You have a fever.  You  have continuous pain in your abdomen. Get help right away if:  Your contractions become stronger, more regular, and closer together.  You have fluid leaking or gushing from your vagina.  You pass blood-tinged mucus (bloody show).  You have bleeding from your vagina.  You have low back pain that you never had before.  You feel your baby's head pushing down and causing pelvic pressure.  Your baby is not moving inside you as much as it used to. Summary  Contractions that occur before labor are called Braxton Hicks contractions, false labor, or practice contractions.  Braxton Hicks contractions are usually shorter, weaker, farther apart, and less regular than true labor contractions. True labor contractions usually become progressively stronger and regular, and they become more frequent.  Manage discomfort from [facility] contractions by changing position, resting in a warm bath, drinking plenty of water, or practicing deep breathing. This information is not intended to replace advice given to you by your health care provider. Make sure you discuss any questions you have with your health care provider. Document Revised: 02/23/2017 Document Reviewed: 07/27/2016 Elsevier Patient Education  2021 [employer].

## 2020-05-04 ENCOUNTER — Ambulatory Visit (INDEPENDENT_AMBULATORY_CARE_PROVIDER_SITE_OTHER): Payer: Medicaid Other | Admitting: Obstetrics

## 2020-05-04 ENCOUNTER — Other Ambulatory Visit: Payer: Self-pay

## 2020-05-04 VITALS — BP 118/70 | Wt 176.0 lb

## 2020-05-04 DIAGNOSIS — Z3480 Encounter for supervision of other normal pregnancy, unspecified trimester: Secondary | ICD-10-CM

## 2020-05-04 DIAGNOSIS — Z113 Encounter for screening for infections with a predominantly sexual mode of transmission: Secondary | ICD-10-CM

## 2020-05-04 DIAGNOSIS — Z3A36 36 weeks gestation of pregnancy: Secondary | ICD-10-CM

## 2020-05-04 NOTE — Progress Notes (Signed)
  Routine Prenatal Care Visit  Subjective  Brynna Dobos is a 20 y.o. G1P0 at [redacted]w[redacted]d being seen today for ongoing prenatal care.  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  ----------------------------------------------------------------------------------- Patient reports no complaints.   Contractions: Not present. Vag. Bleeding: None.  Movement: Present. Leaking Fluid denies.  ----------------------------------------------------------------------------------- The following portions of the patient's history were reviewed and updated as appropriate: allergies, current medications, past family history, past medical history, past social history, past surgical history and problem list. Problem list updated.  Objective  Blood pressure 118/70, weight 176 lb (79.8 kg), last menstrual period 08/20/2019, unknown if currently breastfeeding. Pregravid weight 148 lb (67.1 kg) Total Weight Gain 28 lb (12.7 kg) Urinalysis: Urine Protein    Urine Glucose    Fetal Status:     Movement: Present     General:  Alert, oriented and cooperative. Patient is in no acute distress.  Skin: Skin is warm and dry. No rash noted.   Cardiovascular: Normal heart rate noted  Respiratory: Normal respiratory effort, no problems with respiration noted  Abdomen: Soft, gravid, appropriate for gestational age. Pain/Pressure: Absent     Pelvic:  Cervical exam performed      closed/50@/-3  Extremities: Normal range of motion.     Mental Status: Normal mood and affect. Normal behavior. Normal judgment and thought content.   Assessment   20 y.o. G1P0 at 104w6d by  05/26/2020, by Last Menstrual Period presenting for routine prenatal visit  Plan   pregnancy 1 Problems (from 11/03/19 to present)    Problem Noted Resolved   Supervision of other normal pregnancy, antepartum 10/16/2019  by Tresea Mall, CNM No   Overview Addendum 04/19/2020 10:11 AM by Nadara Mustard, MD    [facility]inic [facility] Prenatal Labs  Dating  LMP = 10 wk [place] Blood type:   O+  Genetic Screen NIPS: XY  Inheritest negative Antibody: negative  Anatomic [place] Normal female Rubella: Immune Varicella:NI  GTT Early: NA               Third trimester: WNL RPR:   non Reactive  Rhogam  not needed HBsAg: negative  Vaccines TDAP:                       Flu Shot: HIV: negative  Baby Food Breast                     GBS:   GC/CT:  Contraception POP Pap:NA (age)  CBB  No   CS/VBAC N/A   Support Person           Previous Version       Term labor symptoms and general obstetric precautions including but not limited to vaginal bleeding, contractions, leaking of fluid and fetal movement were reviewed in detail with the patient. Please refer to After Visit Summary for other counseling recommendations.  GBS and GC/CT retrieved today. Encouraged her to view the virtual breastfeeding class. She feeels like her breasts are asymmetric. Slightly tubular and widely spaced- please evaluate for milk supply issues Postpartally  No follow-ups on file.  Mirna Mires, CNM  05/04/2020 9:59 AM

## 2020-05-04 NOTE — Progress Notes (Signed)
GBS/Aptima today. No vb. No lof.  

## 2020-05-06 LAB — GC/CHLAMYDIA PROBE AMP
Chlamydia trachomatis, NAA: NEGATIVE
Neisseria Gonorrhoeae by PCR: NEGATIVE

## 2020-05-08 LAB — CULTURE, BETA STREP (GROUP B ONLY): Strep Gp B Culture: NEGATIVE

## 2020-05-10 ENCOUNTER — Other Ambulatory Visit: Payer: Self-pay

## 2020-05-10 ENCOUNTER — Encounter: Payer: Self-pay | Admitting: Obstetrics & Gynecology

## 2020-05-10 ENCOUNTER — Ambulatory Visit (INDEPENDENT_AMBULATORY_CARE_PROVIDER_SITE_OTHER): Payer: Medicaid Other | Admitting: Obstetrics & Gynecology

## 2020-05-10 VITALS — BP 118/70 | Wt 178.0 lb

## 2020-05-10 DIAGNOSIS — Z3A37 37 weeks gestation of pregnancy: Secondary | ICD-10-CM

## 2020-05-10 DIAGNOSIS — Z3483 Encounter for supervision of other normal pregnancy, third trimester: Secondary | ICD-10-CM

## 2020-05-10 LAB — POCT URINALYSIS DIPSTICK OB
Glucose, UA: NEGATIVE
POC,PROTEIN,UA: NEGATIVE

## 2020-05-10 NOTE — Progress Notes (Signed)
  Subjective  Fetal Movement? yes Contractions? no Leaking Fluid? no Vaginal Bleeding? no  Objective  BP 118/70   Wt 178 lb (80.7 kg)   LMP 08/20/2019   BMI 26.29 kg/m  General: NAD Pumonary: no increased work of breathing Abdomen: gravid, non-tender Extremities: no edema Psychiatric: mood appropriate, affect full  Assessment  20 y.o. G1P0 at [redacted]w[redacted]d by  05/26/2020, by Last Menstrual Period presenting for routine prenatal visit  Plan   Problem List Items Addressed This Visit      Other   Supervision of other normal pregnancy, antepartum    Other Visit Diagnoses    [redacted] weeks gestation of pregnancy    -  Primary   Relevant Orders   POC Urinalysis Dipstick OB (Completed)      pregnancy 1 Problems (from 11/03/19 to present)    Problem Noted Resolved   Supervision of other normal pregnancy, antepartum 10/16/2019 by Tresea Mall, CNM No   Overview Addendum 05/04/2020 10:01 AM by Mirna Mires, CNM    [facility]inic [facility] Prenatal Labs  Dating  LMP = 10 wk [place] Blood type:   O+  Genetic Screen NIPS: XY  Inheritest negative Antibody: negative  Anatomic [place] Normal female Rubella: Immune Varicella:NI  GTT Early: NA               Third trimester: WNL RPR:   non Reactive  Rhogam  not needed HBsAg: negative  Vaccines TDAP:                       Flu Shot: HIV: negative  Baby Food Breast                     GBS:   GC/CT:05/04/20  Contraception POP Pap:NA (age)  CBB  No   CS/VBAC N/A   Support Person           PNV, FMC, Labor precautions  Plans to breats feed and use POP for pp contraception  Annamarie Major, MD, [facility], [facility] 05/10/2020  8:37 AM

## 2020-05-10 NOTE — Patient Instructions (Signed)
Thank you for choosing [facility]. As part of our ongoing efforts to improve patient experience, we would appreciate your feedback. Please fill out the short survey that you will receive by mail or MyChart. Your opinion is important to [place]! -Dr Caroline Sauger Contractions Contractions of the uterus can occur throughout pregnancy, but they are not always a sign that you are in labor. You may have practice contractions called Braxton Hicks contractions. These false labor contractions are sometimes confused with true labor. What are Deberah Pelton contractions? Braxton Hicks contractions are tightening movements that occur in the muscles of the uterus before labor. Unlike true labor contractions, these contractions do not result in opening (dilation) and thinning of the cervix. Toward the end of pregnancy (32-34 weeks), Braxton Hicks contractions can happen more often and may become stronger. These contractions are sometimes difficult to tell apart from true labor because they can be very uncomfortable. You should not feel embarrassed if you go to the hospital with false labor. Sometimes, the only way to tell if you are in true labor is for your health care provider to look for changes in the cervix. The health care provider will do a physical exam and may monitor your contractions. If you are not in true labor, the exam should show that your cervix is not dilating and your water has not broken. If there are no other health problems associated with your pregnancy, it is completely safe for you to be sent home with false labor. You may continue to have Braxton Hicks contractions until you go into true labor. How to tell the difference between true labor and false labor True labor  Contractions last 30-70 seconds.  Contractions become very regular.  Discomfort is usually felt in the top of the uterus, and it spreads to the lower abdomen and low back.  Contractions do not go away with  walking.  Contractions usually become more intense and increase in frequency.  The cervix dilates and gets thinner. False labor  Contractions are usually shorter and not as strong as true labor contractions.  Contractions are usually irregular.  Contractions are often felt in the front of the lower abdomen and in the groin.  Contractions may go away when you walk around or change positions while lying down.  Contractions get weaker and are shorter-lasting as time goes on.  The cervix usually does not dilate or become thin. Follow these instructions at home:  Take over-the-counter and prescription medicines only as told by your health care provider.  Keep up with your usual exercises and follow other instructions from your health care provider.  Eat and drink lightly if you think you are going into labor.  If Braxton Hicks contractions are making you uncomfortable: ? Change your position from lying down or resting to walking, or change from walking to resting. ? Sit and rest in a tub of warm water. ? Drink enough fluid to keep your urine pale yellow. Dehydration may cause these contractions. ? Do slow and deep breathing several times an hour.  Keep all follow-up prenatal visits as told by your health care provider. This is important.   Contact a health care provider if:  You have a fever.  You have continuous pain in your abdomen. Get help right away if:  Your contractions become stronger, more regular, and closer together.  You have fluid leaking or gushing from your vagina.  You pass blood-tinged mucus (bloody show).  You have bleeding from your vagina.  You have low back pain that you never had before.  You feel your baby's head pushing down and causing pelvic pressure.  Your baby is not moving inside you as much as it used to. Summary  Contractions that occur before labor are called Braxton Hicks contractions, false labor, or practice contractions.  Braxton  Hicks contractions are usually shorter, weaker, farther apart, and less regular than true labor contractions. True labor contractions usually become progressively stronger and regular, and they become more frequent.  Manage discomfort from [facility] contractions by changing position, resting in a warm bath, drinking plenty of water, or practicing deep breathing. This information is not intended to replace advice given to you by your health care provider. Make sure you discuss any questions you have with your health care provider. Document Revised: 02/23/2017 Document Reviewed: 07/27/2016 Elsevier Patient Education  2021 [employer].

## 2020-05-17 ENCOUNTER — Other Ambulatory Visit: Payer: Self-pay

## 2020-05-17 ENCOUNTER — Encounter: Payer: Self-pay | Admitting: Obstetrics & Gynecology

## 2020-05-17 ENCOUNTER — Ambulatory Visit (INDEPENDENT_AMBULATORY_CARE_PROVIDER_SITE_OTHER): Payer: Medicaid Other | Admitting: Obstetrics & Gynecology

## 2020-05-17 VITALS — BP 100/70 | Wt 180.0 lb

## 2020-05-17 DIAGNOSIS — Z3483 Encounter for supervision of other normal pregnancy, third trimester: Secondary | ICD-10-CM

## 2020-05-17 DIAGNOSIS — Z3A38 38 weeks gestation of pregnancy: Secondary | ICD-10-CM

## 2020-05-17 DIAGNOSIS — Z3480 Encounter for supervision of other normal pregnancy, unspecified trimester: Secondary | ICD-10-CM

## 2020-05-17 LAB — POCT URINALYSIS DIPSTICK OB
Glucose, UA: NEGATIVE
POC,PROTEIN,UA: NEGATIVE

## 2020-05-17 NOTE — Progress Notes (Signed)
  Subjective  Fetal Movement? yes Contractions? No, lower abd pains and pressure at times Leaking Fluid? no Vaginal Bleeding? no  Objective  BP 100/70   Wt 180 lb (81.6 kg)   LMP 08/20/2019   BMI 26.58 kg/m  General: NAD Pumonary: no increased work of breathing Abdomen: gravid, non-tender Extremities: no edema Psychiatric: mood appropriate, affect full SVE 0/70/-3, Vtx  Assessment  20 y.o. G1P0 at [redacted]w[redacted]d by  05/26/2020, by Last Menstrual Period presenting for routine prenatal visit  Plan   Problem List Items Addressed This Visit   None   Visit Diagnoses    [redacted] weeks gestation of pregnancy    -  Primary   Encounter for supervision of other normal pregnancy in third trimester          pregnancy 1 Problems (from 11/03/19 to present)    Problem Noted Resolved   Supervision of other normal pregnancy, antepartum 10/16/2019 by Tresea Mall, CNM No   Overview Addendum 05/10/2020 10:42 AM by Mirna Mires, CNM    Clinic [facility] Prenatal Labs  Dating  LMP = 10 wk [place] Blood type:   O+  Genetic Screen NIPS: XY  Inheritest negative Antibody: negative  Anatomic [place] Normal female Rubella: Immune Varicella:NI  GTT Early: NA               Third trimester: WNL RPR:   non Reactive  Rhogam  not needed HBsAg: negative  Vaccines TDAP:          decl             Flu Shot:   decl HIV: negative  Baby Food Breast                     GBS:  negative GC/CT:05/04/20  Contraception POP Pap:NA (age)  CBB  No   CS/VBAC N/A   Support Person BF, Aunt        PNV, FMC, Labor precautions  Prefers no IOL until 41 weeks unless medically indicated  Valerie Major, MD, [facility], [facility] 05/17/2020  8:44 AM

## 2020-05-17 NOTE — Patient Instructions (Signed)

## 2020-05-17 NOTE — Addendum Note (Signed)
Addended by: Cornelius Moras D on: 05/17/2020 08:50 AM   Modules accepted: Orders

## 2020-05-24 ENCOUNTER — Other Ambulatory Visit: Payer: Self-pay

## 2020-05-24 ENCOUNTER — Encounter: Payer: Self-pay | Admitting: Obstetrics and Gynecology

## 2020-05-24 ENCOUNTER — Ambulatory Visit (INDEPENDENT_AMBULATORY_CARE_PROVIDER_SITE_OTHER): Payer: Medicaid Other | Admitting: Obstetrics and Gynecology

## 2020-05-24 ENCOUNTER — Ambulatory Visit: Payer: Medicaid Other | Admitting: Obstetrics and Gynecology

## 2020-05-24 VITALS — BP 100/70 | Wt 181.0 lb

## 2020-05-24 DIAGNOSIS — Z1379 Encounter for other screening for genetic and chromosomal anomalies: Secondary | ICD-10-CM

## 2020-05-24 DIAGNOSIS — Z3A39 39 weeks gestation of pregnancy: Secondary | ICD-10-CM

## 2020-05-24 DIAGNOSIS — Z3483 Encounter for supervision of other normal pregnancy, third trimester: Secondary | ICD-10-CM

## 2020-05-24 LAB — POCT URINALYSIS DIPSTICK OB
Glucose, UA: NEGATIVE
POC,PROTEIN,UA: NEGATIVE

## 2020-05-24 NOTE — Progress Notes (Signed)
    Routine Prenatal Care Visit  Subjective  Valerie Conner is a 20 y.o. G1P0 at [redacted]w[redacted]d being seen today for ongoing prenatal care.  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  ----------------------------------------------------------------------------------- Patient reports increased pelvic pressure. .   Contractions: Irregular. Vag. Bleeding: None.  Movement: Present. Denies leaking of fluid.  ----------------------------------------------------------------------------------- The following portions of the patient's history were reviewed and updated as appropriate: allergies, current medications, past family history, past medical history, past social history, past surgical history and problem list. Problem list updated.   Objective  Blood pressure 100/70, weight 181 lb (82.1 kg), last menstrual period 08/20/2019, unknown if currently breastfeeding. Pregravid weight 148 lb (67.1 kg) Total Weight Gain 33 lb (15 kg) Urinalysis:      Fetal Status: Fetal Heart Rate (bpm): 140 Fundal Height: 38 cm Movement: Present  Presentation: Vertex  General:  Alert, oriented and cooperative. Patient is in no acute distress.  Skin: Skin is warm and dry. No rash noted.   Cardiovascular: Normal heart rate noted  Respiratory: Normal respiratory effort, no problems with respiration noted  Abdomen: Soft, gravid, appropriate for gestational age. Pain/Pressure: Present     Pelvic:  Cervical exam performed Dilation: 1 Effacement (%): 50 Station: -3  Extremities: Normal range of motion.     ental Status: Normal mood and affect. Normal behavior. Normal judgment and thought content.     Assessment   20 y.o. G1P0 at [redacted]w[redacted]d by  05/26/2020, by Last Menstrual Period presenting for routine prenatal visit  Plan   pregnancy 1 Problems (from 11/03/19 to present)     Problem Noted Resolved   Supervision of other normal pregnancy, antepartum 10/16/2019 by Tresea Mall, CNM No   Overview Addendum 05/17/2020  8:46 AM by Nadara Mustard, MD    Clinic [facility] Prenatal Labs  Dating  LMP = 10 wk [place] Blood type: O/Positive/-- (08/02 1212) O+  Genetic Screen NIPS: XY  Inheritest negative Antibody:Negative (08/02 1212)negative  Anatomic [place] Normal female Rubella: Immune Varicella:NI  GTT Early: NA               Third trimester: WNL RPR: Non Reactive (08/02 1212) non Reactive  Rhogam  not needed HBsAg: negative  Vaccines TDAP:decl                  Flu Shot:decl HIV: negative  Baby Food Breast                     YOV:[id number]/-- (02/08 1029) negative GC/CT:05/04/20  Contraception POP Pap:NA (age)  CBB  No   CS/VBAC N/A   Support Person BF and Aunt         Previous Version      -Patient desires spontaneous onset of labor. Undecided about scheduling an IOL for 41 weeks today. Plan made to discuss further at next visit. -Patient declined membrane sweep.  Full-term labor precautions including but not limited to vaginal bleeding, contractions, leaking of fluid and fetal movement were reviewed in detail with the patient.    Return in about 1 week (around 05/31/2020) for ROB with NST.  Zipporah Plants, CNM, MSN [facility], [facility] 05/24/2020, 9:29 AM

## 2020-05-24 NOTE — Progress Notes (Signed)
    Routine Prenatal Care Visit  Subjective  Valerie Conner is a 20 y.o. G1P0 at [redacted]w[redacted]d being seen today for ongoing prenatal care.  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  ----------------------------------------------------------------------------------- Patient returned to the office with the desire to have her membranes swept following ROB this AM..   Contractions: Irregular. Vag. Bleeding: None.  Movement: Present. Denies leaking of fluid.  ----------------------------------------------------------------------------------- The following portions of the patient's history were reviewed and updated as appropriate: allergies, current medications, past family history, past medical history, past social history, past surgical history and problem list. Problem list updated.   Objective  Last menstrual period 08/20/2019, unknown if currently breastfeeding. Pregravid weight 148 lb (67.1 kg) Total Weight Gain 33 lb (15 kg) Urinalysis:      Fetal Status: Fetal Heart Rate (bpm): 155   Movement: Present  Presentation: Vertex  General:  Alert, oriented and cooperative. Patient is in no acute distress.  Skin: Skin is warm and dry. No rash noted.   Cardiovascular: Normal heart rate noted  Respiratory: Normal respiratory effort, no problems with respiration noted  Abdomen: Soft, gravid, appropriate for gestational age. Pain/Pressure: Present     Pelvic:  Cervical exam performed Dilation: 1 Effacement (%): 50 Station: -3  Extremities: Normal range of motion.     ental Status: Normal mood and affect. Normal behavior. Normal judgment and thought content.    Assessment   20 y.o. G1P0 at [redacted]w[redacted]d by  05/26/2020, by Last Menstrual Period presenting for work-in prenatal visit  Plan   pregnancy 1 Problems (from 11/03/19 to present)    Problem Noted  Resolved   Supervision of other normal pregnancy, antepartum 10/16/2019 by Tresea Mall, CNM No   Overview Addendum 05/17/2020  8:46 AM by Nadara Mustard, MD    Clinic [facility] Prenatal Labs  Dating  LMP = 10 wk [place] Blood type: O/Positive/-- (08/02 1212) O+  Genetic Screen NIPS: XY  Inheritest negative Antibody:Negative (08/02 1212)negative  Anatomic [place] Normal female Rubella: Immune Varicella:NI  GTT Early: NA               Third trimester: WNL RPR: Non Reactive (08/02 1212) non Reactive  Rhogam  not needed HBsAg: negative  Vaccines TDAP:decl                  Flu Shot:decl HIV: negative  Baby Food Breast                     VOH:[id number]/-- (02/08 1029) negative GC/CT:05/04/20  Contraception POP Pap:NA (age)  CBB  No   CS/VBAC N/A   Support Person BF and Aunt         Previous Version      -Cervical exam performed and membranes swept per patient request  Term labor precautions reviewed including but not limited to vaginal bleeding, contractions, leaking of fluid and fetal movement were reviewed in detail with the patient.    Keep previously scheduled f/u.  Zipporah Plants, CNM, MSN [facility], [facility] 05/24/2020, 3:39 PM

## 2020-05-27 ENCOUNTER — Other Ambulatory Visit: Payer: Self-pay

## 2020-05-27 ENCOUNTER — Encounter: Payer: Self-pay | Admitting: Obstetrics and Gynecology

## 2020-05-27 ENCOUNTER — Observation Stay
Admission: EM | Admit: 2020-05-27 | Discharge: 2020-05-28 | Disposition: A | Discharge status: Home / Self Care | Payer: Medicaid Other | Attending: Obstetrics and Gynecology | Admitting: Obstetrics and Gynecology

## 2020-05-27 DIAGNOSIS — O471 False labor at or after 37 completed weeks of gestation: Principal | ICD-10-CM | POA: Insufficient documentation

## 2020-05-27 DIAGNOSIS — Z3490 Encounter for supervision of normal pregnancy, unspecified, unspecified trimester: Secondary | ICD-10-CM

## 2020-05-27 DIAGNOSIS — Z3A40 40 weeks gestation of pregnancy: Secondary | ICD-10-CM | POA: Insufficient documentation

## 2020-05-27 DIAGNOSIS — Z3480 Encounter for supervision of other normal pregnancy, unspecified trimester: Secondary | ICD-10-CM

## 2020-05-27 NOTE — OB Triage Note (Signed)
[facility] with complaints of ctx. Pt states ctx started a couple days ago and are now about 2 mins a part. Pt stated positive FM. Pt denies LOF, N/V and vaginal bleeding. Monitors applied and assessing. Initial FHT 120.

## 2020-05-28 DIAGNOSIS — Z3490 Encounter for supervision of normal pregnancy, unspecified, unspecified trimester: Secondary | ICD-10-CM

## 2020-05-28 DIAGNOSIS — Z3A40 40 weeks gestation of pregnancy: Secondary | ICD-10-CM | POA: Diagnosis not present

## 2020-05-28 DIAGNOSIS — O471 False labor at or after 37 completed weeks of gestation: Secondary | ICD-10-CM | POA: Diagnosis not present

## 2020-05-28 NOTE — Discharge Instructions (Signed)
LABOR: When contractions begin, you should start to time them from the beginning of one contraction to the beginning of the next.  When contractions are 5-10 minutes apart or less and have been regular for at least an hour, you should call your health care provider.  Notify your doctor if any of the following occur: 1. Bleeding from the vagina 7. Sudden, constant, or occasional abdominal pain  2. Pain or burning when urinating 8. Sudden gushing of fluid from the vagina (with or without continued leaking)  3. Chills or fever 9. Fainting spells, "black outs" or loss of consciousness  4. Increase in vaginal discharge 10. Severe or continued nausea or vomiting  5. Pelvic pressure (sudden increase) 11. Blurring of vision or spots before the eyes  6. Baby moving less than usual 12. Leaking of fluid

## 2020-05-28 NOTE — OB Triage Note (Signed)
Pt discharged home in stable condition. RN provided discharge instructions to pt, including information on when to come back. Follow up care reviewed. Pt verbalized understanding and all questions answered at this time.

## 2020-05-31 ENCOUNTER — Ambulatory Visit (INDEPENDENT_AMBULATORY_CARE_PROVIDER_SITE_OTHER): Payer: Medicaid Other | Admitting: Obstetrics and Gynecology

## 2020-05-31 ENCOUNTER — Other Ambulatory Visit
Admission: RE | Admit: 2020-05-31 | Discharge: 2020-05-31 | Disposition: A | Discharge status: Home / Self Care | Payer: Medicaid Other | Source: Ambulatory Visit | Attending: Obstetrics and Gynecology | Admitting: Obstetrics and Gynecology

## 2020-05-31 ENCOUNTER — Other Ambulatory Visit: Payer: Self-pay

## 2020-05-31 VITALS — BP 100/70 | Wt 179.0 lb

## 2020-05-31 DIAGNOSIS — Z01812 Encounter for preprocedural laboratory examination: Secondary | ICD-10-CM | POA: Diagnosis not present

## 2020-05-31 DIAGNOSIS — Z20822 Contact with and (suspected) exposure to covid-19: Secondary | ICD-10-CM | POA: Insufficient documentation

## 2020-05-31 DIAGNOSIS — Z3A40 40 weeks gestation of pregnancy: Secondary | ICD-10-CM | POA: Diagnosis not present

## 2020-05-31 DIAGNOSIS — Z3480 Encounter for supervision of other normal pregnancy, unspecified trimester: Secondary | ICD-10-CM

## 2020-05-31 LAB — POCT URINALYSIS DIPSTICK OB: Glucose, UA: NEGATIVE

## 2020-05-31 NOTE — Patient Instructions (Signed)
PRE ADMISSION TESTING For Covid, prior to procedure Monday 9:00-10:00 [facility] entrance (drive up)  Results in 95-63 hours You will not receive notification if test results are negative. If positive for Covid19, your provider will notify you by phone, with additional instructions.  Labor Induction Labor induction is when steps are taken to cause a pregnant woman to begin the labor process. Most women go into labor on their own between 37 weeks and 42 weeks of pregnancy. When this does not happen, or when there is a medical need for labor to begin, steps may be taken to induce, or bring on, labor. Labor induction causes a pregnant woman's uterus to contract. It also causes the cervix to soften (ripen), open (dilate), and thin out. Usually, labor is not induced before 39 weeks of pregnancy unless there is a medical reason to do so. When is labor induction considered? Labor induction may be right for you if:  Your pregnancy lasts longer than 41 to 42 weeks.  Your placenta is separating from your uterus (placental abruption).  You have a rupture of membranes and your labor does not begin.  You have health problems, like diabetes or high blood pressure (preeclampsia) during your pregnancy.  Your baby has stopped growing or does not have enough amniotic fluid. Before labor induction begins, your health care provider will consider the following factors:  Your medical condition and the baby's condition.  How many weeks you have been pregnant.  How mature the baby's lungs are.  The condition of your cervix.  The position of the baby.  The size of your birth canal. Tell a health care provider about:  Any allergies you have.  All medicines you are taking, including vitamins, herbs, eye drops, creams, and over-the-counter medicines.  Any problems you or your family members have had with anesthetic medicines.  Any surgeries you have had.  Any blood disorders you  have.  Any medical conditions you have. What are the risks? Generally, this is a safe procedure. However, problems may occur, including:  Failed induction.  Changes in fetal heart rate, such as being too high, too low, or irregular (erratic).  Infection in the mother or the baby.  Increased risk of having a cesarean delivery.  Breaking off (abruption) of the placenta from the uterus. This is rare.  Rupture of the uterus. This is very rare.  Your baby could fail to get enough blood flow or oxygen. This can be life-threatening. When induction is needed for medical reasons, the benefits generally outweigh the risks. What happens during the procedure? During the procedure, your health care provider will use one of these methods to induce labor:  Stripping the membranes. In this method, the amniotic sac tissue is gently separated from the cervix. This causes the following to happen: ? Your cervix stretches, which in turn causes the release of prostaglandins. ? Prostaglandins induce labor and cause the uterus to contract. ? This procedure is often done in an office visit. You will be sent home to wait for contractions to begin.  Prostaglandin medicine. This medicine starts contractions and causes the cervix to dilate and ripen. This can be taken by mouth (orally) or by being inserted into the vagina (suppository).  Inserting a small, thin tube (catheter) with a balloon into the vagina and then expanding the balloon with water to dilate the cervix.  Breaking the water. In this method, a small instrument is used to make a small hole in the amniotic sac. This eventually  eventually causes the amniotic sac to break. Contractions should begin within a few hours.  Medicine to trigger or strengthen contractions. This medicine is given through an IV that is inserted into a vein in your arm. This procedure may vary among health care providers and hospitals.   Where to find more information  March of  Dimes: www.marchofdimes.org  The American College of Obstetricians and Gynecologists: www.acog.org Summary  Labor induction causes a pregnant woman's uterus to contract. It also causes the cervix to soften (ripen), open (dilate), and thin out.  Labor is usually not induced before 39 weeks of pregnancy unless there is a medical reason to do so.  When induction is needed for medical reasons, the benefits generally outweigh the risks.  Talk with your health care provider about which methods of labor induction are right for you. This information is not intended to replace advice given to you by your health care provider. Make sure you discuss any questions you have with your health care provider. Document Revised: 12/25/2019 Document Reviewed: 12/25/2019 Elsevier Patient Education  2021 Elsevier Inc.    

## 2020-05-31 NOTE — Progress Notes (Signed)
Routine Prenatal Care Visit  Subjective  Valerie Conner is a 20 y.o. G1P0 at [redacted]w[redacted]d being seen today for ongoing prenatal care.  She is currently monitored for the following issues for this low-risk pregnancy and has Supervision of other normal pregnancy, antepartum; Catamenial epilepsy (HCC); and Pregnancy on their problem list.  ----------------------------------------------------------------------------------- Patient reports no complaints.   Contractions: Irregular. Vag. Bleeding: None.  Movement: Present. Denies leaking of fluid.  ----------------------------------------------------------------------------------- The following portions of the patient's history were reviewed and updated as appropriate: allergies, current medications, past family history, past medical history, past social history, past surgical history and problem list. Problem list updated.   Objective  Blood pressure 100/70, weight 179 lb (81.2 kg), last menstrual period 08/20/2019, unknown if currently breastfeeding. Pregravid weight 148 lb (67.1 kg) Total Weight Gain 31 lb (14.1 kg) Urinalysis:      Fetal Status: Fetal Heart Rate (bpm): 125 (RNST)   Movement: Present  Presentation: Vertex   NONSTRESS TEST INTERPRETATION  INDICATIONS: patient reassurance FHR baseline: 125 bmp RESULTS:  A NST procedure was performed with FHR monitoring and a normal baseline established, appropriate time of 20-40 minutes of evaluation, and accels >2 seen w 15x15 characteristics.  Results show a REACTIVE NST.    General:  Alert, oriented and cooperative. Patient is in no acute distress.  Skin: Skin is warm and dry. No rash noted.   Cardiovascular: Normal heart rate noted  Respiratory: Normal respiratory effort, no problems with respiration noted  Abdomen: Soft, gravid, appropriate for gestational age. Pain/Pressure: Present     Pelvic:  Cervical exam performed Dilation: 1.5 Effacement (%): 70 Station: -3  Extremities: Normal  range of motion.  Edema: None  ental Status: Normal mood and affect. Normal behavior. Normal judgment and thought content.     Assessment   20 y.o. G1P0 at [redacted]w[redacted]d by  05/26/2020, by Last Menstrual Period presenting for routine prenatal visit  Plan   pregnancy 1 Problems (from 11/03/19 to present)    Problem Noted Resolved   Supervision of other normal pregnancy, antepartum 10/16/2019 by Tresea Mall, CNM No   Overview Addendum 05/17/2020  8:46 AM by Nadara Mustard, MD    [facility] Prenatal Labs  Dating  LMP = 10 wk [place] Blood type: O/Positive/-- (08/02 1212) O+  Genetic Screen NIPS: XY  Inheritest negative Antibody:Negative (08/02 1212)negative  Anatomic [place] Normal female Rubella: Immune Varicella:NI  GTT Early: NA               Third trimester: WNL RPR: Non Reactive (08/02 1212) non Reactive  Rhogam  not needed HBsAg: negative  Vaccines TDAP:decl                  Flu Shot:decl HIV: negative  Baby Food Breast                     OFB:PZWCHENI/-- (02/08 1029) negative GC/CT:05/04/20  Contraception POP Pap:NA (age)  CBB  No   CS/VBAC N/A   Support Person BF and Aunt         Previous Version      -Patient desires 41 week IOL - scheduled for 06/02/20 at 0800. Reviewed process of IOL with patient including medication management and fetal monitoring. All questions answered. -Membranes swept today. -Will present for Covid-19 PTA testing today.  Term labor precautions including but not limited to vaginal bleeding, contractions, leaking of fluid and fetal movement were reviewed in detail with the patient.  Zipporah Plants, CNM, MSN [facility], [facility] 05/31/2020, 10:29 AM

## 2020-05-31 NOTE — Progress Notes (Signed)
  History and Physical  Valerie Conner is a 20 y.o. G1P0 [redacted]w[redacted]d  for Induction of Labor scheduled due to Postdates .   Pregnancy course has been uncomplicated. The patient reports intermittent contractions, typically associated with back pain. Patient denies vaginal bleeding, ruptured membranes, or other signs of progressing labor at this time. She reports +fetal movement.  PMHx: She  has a past medical history of GERD (gastroesophageal reflux disease). Also,  has no past surgical history on file., family history is not on file.,  reports that she has never smoked. She has never used smokeless tobacco. She reports that she does not drink alcohol and does not use drugs. She has a current medication list which includes the following prescription(s): butalbital-apap-caffeine and prenatal vit-fe fumarate-fa. Also, has No Known Allergies. OB History  Gravida Para Term Preterm AB Living  1            SAB IAB Ectopic Multiple Live Births               # Outcome Date GA Lbr Len/2nd Weight Sex Delivery Anes PTL Lv  1 Current           Patient denies any other pertinent gynecologic issues.   Review of Systems  Constitutional: Negative.   HENT: Negative.   Eyes: Negative.   Respiratory: Negative.   Cardiovascular: Negative.   Gastrointestinal: Negative.   Genitourinary: Negative.   Musculoskeletal: Negative.   Skin: Negative.   Neurological: Negative.   Endo/Heme/Allergies: Negative.   Psychiatric/Behavioral: Negative.     Objective: BP 100/70   Wt 179 lb (81.2 kg)   LMP 08/20/2019   BMI 26.43 kg/m  Physical Exam Constitutional:      Appearance: Normal appearance.  HENT:     Head: Normocephalic.  Eyes:     Pupils: Pupils are equal, round, and reactive to light.  Cardiovascular:     Rate and Rhythm: Normal rate and regular rhythm.  Pulmonary:     Effort: Pulmonary effort is normal.     Breath sounds: Normal breath sounds.  Abdominal:     Comments: Gravid, size c/w with 38-40  week dates Leopolds: vtx, EFW 7lbs  Musculoskeletal:     Cervical back: Normal range of motion.  Neurological:     Mental Status: She is alert.     Assessment: Term Pregnancy for Induction of Labor due to Postdates.  Plan: Patient will undergo induction of labor with cervical ripening agents, scheduled for 06/02/20 at 0800. Patient will present for PTA Covid-19 testing today.  Patient has been fully informed of the pros and cons, risks and benefits of continued observation with fetal monitoring versus that of induction of labor.   She understands that there are uncommon risks to induction, which include but are not limited to : frequent or prolonged uterine contractions, fetal distress, uterine rupture, and lack of successful induction.  These risks include all methods including Pitocin and Misoprostol.  Patient understands that using Misoprostol for labor induction is an "off label" indication although it has been studied extensively for this purpose and is an accepted method of induction.  She also has been informed of the increased risks for Cesarean with induction and should induction not be successful.  Patient consents to the induction plan of management.  Plans to breast feed Plans oral progesterone-only contraceptive for contraception TDaP - declined 04/05/20  Zipporah Plants, CNM, MSN [facility], [facility] 05/31/2020  12:17 PM

## 2020-06-01 LAB — SARS CORONAVIRUS 2 (TAT 6-24 HRS): SARS Coronavirus 2: NEGATIVE

## 2020-06-01 NOTE — Progress Notes (Signed)
  [facility] BIRTHPLACE INDUCTION ASSESSMENT Valerie Conner [birth year] Medical record #: [id number] Phone #:  Home Phone [phone]  Mobile [phone]    Prenatal Provider:[facility] Delivering Group[facility] Proposed admission date/time: 06/02/20 at 0800 Method of induction:Cytotec  Weight: Filed Weights03/07/22 0932Weight:179 lb (81.2 kg) BMI Body mass index is 26.43 kg/m. HIV Negative HSV Negative EDC Estimated Date of Delivery: 3/2/22based on:LMP  Gestational age on admission: 41weeks Gravidity/parity:G1P0  Cervix Score   0 1 2 3   Position Posterior Midposition Anterior   Consistency Firm Medium Soft   Effacement (%) 0-30 40-50 60-70 >80  Dilation (cm) Closed 1-2 3-4 >5  Baby's station -3 -2 -1 +1, +2   Bishop Score:5   Medical induction of labor  select indication(s) below Elective induction ?39 weeks multiparous patient ?39 weeks primiparous patient with Bishop score ?7 ?40 weeks primiparous patient   Medical Indications Adapted from ACOG Committee Opinion #560, "Medically Indicated Late Preterm and Early Term Deliveries," 2013.  PLACENTAL / UTERINE ISSUES FETAL ISSUES MATERNAL ISSUES  ? Placenta previa (36.0-37.6) ? Isoimmunization (37.0-38.6) ? Preeclampsia without severe features or gestational HTN (37.0)  ? Suspected accreta (34.0-35.6) ? Growth Restriction [birth year]1984) ? Preeclampsia with severe features (34.0)  ? Prior classical CD, uterine window, rupture (36.0-37.6) ? Isolated (38.0-39.6) ? Chronic HTN (38.0-39.6)  ? Prior myomectomy (37.0-38.6) ? Concurrent findings (34.0-37.6) ? Cholestasis (37.0)  ? Umbilical vein varix (37.0) ? Growth Restriction (Twins) ? Diabetes  ? Placental abruption (chronic) ? Di-Di Isolated (36.0-37.6) ? Pregestational, controlled (39.0)  OBSTETRIC ISSUES ? Di-Di concurrent findings (32.0-34.6) ? Pregestational, uncontrolled (37.0-39.0)  ? Postdates ? (41 weeks) ? Mo-Di isolated (32.0-34.6) ? Pregestational,  vascular compromise (37.0- 39.0)  ? PPROM (34.0) ? Multiple Gestation ? Gestational, diet controlled (40.0)  ? Hx of IUFD (39.0 weeks) ? Di-Di (38.0-38.6) ? Gestational, med controlled (39.0)  ? Polyhydramnios, mild/moderate; SDV 8-16 or AFI 25-35 (39.0) ? Mo-Di (36.0-37.6) ? Gestational, uncontrolled (38.0-39.0)  ? Oligohydramnios (36.0-37.6); MVP <2 cm  For indications not listed above, delivery recommendations from maternal-fetal medicine consultant occurred on: Date: n/a   Provider Signature: 06[birth year]-1986 Scheduled by: Zipporah Plants, RN Date:06/01/2020 11:32 AM   Call [phone] to finalize the induction date/time  [phone] (07/17)

## 2020-06-01 NOTE — Discharge Summary (Signed)
See Final Progress Note 

## 2020-06-01 NOTE — Final Progress Note (Signed)
Patient presented for evaluation of labor.  Patient had cervical exam by RN and this was reported to me. I reviewed her vital signs and fetal tracing, both of which were reassuring.  Patient was discharge as she was not laboring.   Thomasene Mohair, MD, [facility], [facility] 06/01/2020 12:53 PM

## 2020-06-02 ENCOUNTER — Encounter: Payer: Self-pay | Admitting: Obstetrics & Gynecology

## 2020-06-02 ENCOUNTER — Inpatient Hospital Stay
Admission: EM | Admit: 2020-06-02 | Discharge: 2020-06-04 | DRG: 806 | Disposition: A | Discharge status: Home / Self Care | Payer: Medicaid Other | Attending: Obstetrics | Admitting: Obstetrics

## 2020-06-02 ENCOUNTER — Other Ambulatory Visit: Payer: Self-pay

## 2020-06-02 DIAGNOSIS — O9081 Anemia of the puerperium: Secondary | ICD-10-CM | POA: Diagnosis not present

## 2020-06-02 DIAGNOSIS — D62 Acute posthemorrhagic anemia: Secondary | ICD-10-CM | POA: Diagnosis not present

## 2020-06-02 DIAGNOSIS — Z3480 Encounter for supervision of other normal pregnancy, unspecified trimester: Principal | ICD-10-CM

## 2020-06-02 DIAGNOSIS — O480 Post-term pregnancy: Secondary | ICD-10-CM | POA: Diagnosis present

## 2020-06-02 DIAGNOSIS — O328XX0 Maternal care for other malpresentation of fetus, not applicable or unspecified: Secondary | ICD-10-CM | POA: Diagnosis not present

## 2020-06-02 DIAGNOSIS — Z3A41 41 weeks gestation of pregnancy: Secondary | ICD-10-CM | POA: Diagnosis not present

## 2020-06-02 HISTORY — DX: Headache, unspecified: R51.9

## 2020-06-02 LAB — TYPE AND SCREEN
ABO/RH(D): O POS
Antibody Screen: NEGATIVE

## 2020-06-02 LAB — CBC
HCT: 34.5 % — ABNORMAL LOW (ref 36.0–46.0)
Hemoglobin: 11.4 g/dL — ABNORMAL LOW (ref 12.0–15.0)
MCH: 29.5 pg (ref 26.0–34.0)
MCHC: 33 g/dL (ref 30.0–36.0)
MCV: 89.1 fL (ref 80.0–100.0)
Platelets: 223 10*3/uL (ref 150–400)
RBC: 3.87 MIL/uL (ref 3.87–5.11)
RDW: 13.3 % (ref 11.5–15.5)
WBC: 11.8 10*3/uL — ABNORMAL HIGH (ref 4.0–10.5)
nRBC: 0 % (ref 0.0–0.2)

## 2020-06-02 LAB — ABO/RH: ABO/RH(D): O POS

## 2020-06-02 MED ORDER — ONDANSETRON HCL 4 MG/2ML IJ SOLN: 4.0000 mg | Freq: Four times a day (QID) | INTRAMUSCULAR | Status: DC | PRN

## 2020-06-02 MED ORDER — OXYTOCIN-SODIUM CHLORIDE 30-0.9 UT/500ML-% IV SOLN
2.5000 [IU]/h | INTRAVENOUS | Status: DC
  Filled 2020-06-02: qty 500

## 2020-06-02 MED ORDER — LACTATED RINGERS IV SOLN
500.0000 mL | INTRAVENOUS | Status: DC | PRN
  Administered 2020-06-03: 500 mL via INTRAVENOUS

## 2020-06-02 MED ORDER — ACETAMINOPHEN 325 MG PO TABS: 650.0000 mg | ORAL_TABLET | ORAL | Status: DC | PRN

## 2020-06-02 MED ORDER — OXYTOCIN 10 UNIT/ML IJ SOLN
INTRAMUSCULAR | Status: AC
  Filled 2020-06-02: qty 2

## 2020-06-02 MED ORDER — LIDOCAINE HCL (PF) 1 % IJ SOLN
INTRAMUSCULAR | Status: AC
  Filled 2020-06-02: qty 30

## 2020-06-02 MED ORDER — LIDOCAINE HCL (PF) 1 % IJ SOLN: 30.0000 mL | INTRAMUSCULAR | Status: DC | PRN

## 2020-06-02 MED ORDER — AMMONIA AROMATIC IN INHA
RESPIRATORY_TRACT | Status: AC
  Filled 2020-06-02: qty 10

## 2020-06-02 MED ORDER — MISOPROSTOL 25 MCG QUARTER TABLET
25.0000 ug | ORAL_TABLET | ORAL | Status: DC | PRN
  Administered 2020-06-02: 25 ug via VAGINAL
  Filled 2020-06-02 (×3): qty 1

## 2020-06-02 MED ORDER — OXYTOCIN BOLUS FROM INFUSION: 333.0000 mL | Freq: Once | INTRAVENOUS | Status: DC

## 2020-06-02 MED ORDER — LACTATED RINGERS IV SOLN: INTRAVENOUS | Status: DC

## 2020-06-02 MED ORDER — MISOPROSTOL 200 MCG PO TABS
ORAL_TABLET | ORAL | Status: AC
  Administered 2020-06-02: 25 ug via VAGINAL
  Filled 2020-06-02: qty 4

## 2020-06-02 MED ORDER — SOD CITRATE-CITRIC ACID 500-334 MG/5ML PO SOLN: 30.0000 mL | ORAL | Status: DC | PRN

## 2020-06-02 MED ORDER — TERBUTALINE SULFATE 1 MG/ML IJ SOLN: 0.2500 mg | Freq: Once | INTRAMUSCULAR | Status: DC | PRN

## 2020-06-02 MED ORDER — FENTANYL 2.5 MCG/ML W/ROPIVACAINE 0.15% IN NS 100 ML EPIDURAL (ARMC)
EPIDURAL | Status: AC
  Filled 2020-06-02: qty 100

## 2020-06-02 NOTE — Progress Notes (Signed)
Valerie Conner is a 20 y.o. G1P0 at [redacted]w[redacted]d by ultrasound admitted for induction of labor due to Post dates. Due date 05/26/2020.  Subjective: four hours after Cytotec placement, she is feeling contractions, largely in her back. They are mildly uncomfortable.   Objective: BP 126/82   Pulse 74   Temp 98.2 F (36.8 C) (Oral)   Resp 16   Ht 5\' 9"  (1.753 m)   Wt 80.7 kg   LMP 08/20/2019   BMI 26.29 kg/m  I/O last 3 completed shifts: In: 125.9 [I.V.:125.9] Out: -  No intake/output data recorded.  FHT:  FHR: 130 baseline bpm, variability: moderate,  accelerations:  Present,  decelerations:  Absent UC:   irregular, mild to touch SVE:   Dilation: tight 2cms Effacement (%): 80 Station: -2 Exam by:: M.Fryer, CNM Bishop score:6  Labs: Lab Results  Component Value Date   WBC 11.8 (H) 06/02/2020   HGB 11.4 (L) 06/02/2020   HCT 34.5 (L) 06/02/2020   MCV 89.1 06/02/2020   PLT 223 06/02/2020    Assessment / Plan: Induction of labor due to postterm,  progressing well on pitocin  Labor: Progressing normally  With cervical ripening.  Fetal Wellbeing:  Category I Pain Control:  plans epidural once active I/D:  n/a Anticipated MOD:  NSVD  Second dose of Cytotec placed. Will consider redose in 4-6 hours. Consider pitocin once Bishop score increased.  08/02/2020 06/02/2020, 8:43 PM

## 2020-06-02 NOTE — Progress Notes (Signed)
Valerie Conner is a 20 y.o. G1P0 at [redacted]w[redacted]d by ultrasound admitted for induction of labor due to Post dates. Due date 06/27/2020. She has two family members for support with her. .  Subjective: Lexxi denies any regular contractions. Her baby has been moving well.  She passed part of her mucous plug yesterday. She understands the induction process well,having met with Kate Veal CNM who has explained the role of Cytotec for cervical ripening, as well as pitocin.   Objective: BP 117/79 (BP Location: Left Arm)   Pulse 98   Temp 98.1 F (36.7 C) (Oral)   Resp 16   Ht 5' 9" (1.753 m)   Wt 80.7 kg   LMP 08/20/2019   BMI 26.29 kg/m  No intake/output data recorded. No intake/output data recorded.  FHT:  FHR: 130 baseline bpm, variability: moderate,  accelerations:  Present,  decelerations:  Absent UC:   Irregular, and very mild SVE:   Dilation: 1.5 Effacement (%): 70 Station: -3 Exam by:: M.Fryer, CNM  Cervix is extremely posterior, and moderately firm Bishop score:4  Labs: Lab Results  Component Value Date   WBC 11.8 (H) 06/02/2020   HGB 11.4 (L) 06/02/2020   HCT 34.5 (L) 06/02/2020   MCV 89.1 06/02/2020   PLT 223 06/02/2020    Assessment / Plan: induction of labor at 41 weeks. to start with cervical ripening  Labor: cervical ripening. cytotec 25 mcg placed vaginally by the cervix  Fetal Wellbeing:  Category I Pain Control:  Labor support without medications I/D:  n/a Anticipated MOD:  NSVD  Margaret M Fryer 06/02/2020, 5:06 PM  

## 2020-06-03 ENCOUNTER — Encounter: Payer: Self-pay | Admitting: Obstetrics & Gynecology

## 2020-06-03 ENCOUNTER — Inpatient Hospital Stay: Payer: Medicaid Other | Admitting: Anesthesiology

## 2020-06-03 DIAGNOSIS — O328XX0 Maternal care for other malpresentation of fetus, not applicable or unspecified: Secondary | ICD-10-CM

## 2020-06-03 DIAGNOSIS — Z3A41 41 weeks gestation of pregnancy: Secondary | ICD-10-CM

## 2020-06-03 DIAGNOSIS — O480 Post-term pregnancy: Principal | ICD-10-CM

## 2020-06-03 LAB — RPR: RPR Ser Ql: NONREACTIVE

## 2020-06-03 MED ORDER — TETANUS-DIPHTH-ACELL PERTUSSIS 5-2.5-18.5 LF-MCG/0.5 IM SUSY: 0.5000 mL | PREFILLED_SYRINGE | Freq: Once | INTRAMUSCULAR | Status: DC

## 2020-06-03 MED ORDER — ACETAMINOPHEN 325 MG PO TABS: 650.0000 mg | ORAL_TABLET | ORAL | Status: DC | PRN

## 2020-06-03 MED ORDER — SIMETHICONE 80 MG PO CHEW: 80.0000 mg | CHEWABLE_TABLET | ORAL | Status: DC | PRN

## 2020-06-03 MED ORDER — ONDANSETRON HCL 4 MG PO TABS: 4.0000 mg | ORAL_TABLET | ORAL | Status: DC | PRN

## 2020-06-03 MED ORDER — DIPHENHYDRAMINE HCL 25 MG PO CAPS: 25.0000 mg | ORAL_CAPSULE | Freq: Four times a day (QID) | ORAL | Status: DC | PRN

## 2020-06-03 MED ORDER — WITCH HAZEL-GLYCERIN EX PADS: 1.0000 "application " | MEDICATED_PAD | CUTANEOUS | Status: DC | PRN

## 2020-06-03 MED ORDER — PRENATAL MULTIVITAMIN CH
1.0000 | ORAL_TABLET | Freq: Every day | ORAL | Status: DC
  Administered 2020-06-03 – 2020-06-04 (×2): 1 via ORAL
  Filled 2020-06-03 (×2): qty 1

## 2020-06-03 MED ORDER — LIDOCAINE-EPINEPHRINE (PF) 1.5 %-1:200000 IJ SOLN
INTRAMUSCULAR | Status: DC | PRN
  Administered 2020-06-03: 3 mL via EPIDURAL

## 2020-06-03 MED ORDER — OXYCODONE HCL 5 MG PO TABS: 5.0000 mg | ORAL_TABLET | ORAL | Status: DC | PRN

## 2020-06-03 MED ORDER — IBUPROFEN 600 MG PO TABS
ORAL_TABLET | ORAL | Status: AC
  Filled 2020-06-03: qty 1

## 2020-06-03 MED ORDER — ONDANSETRON HCL 4 MG/2ML IJ SOLN: 4.0000 mg | INTRAMUSCULAR | Status: DC | PRN

## 2020-06-03 MED ORDER — DIPHENHYDRAMINE HCL 50 MG/ML IJ SOLN: 12.5000 mg | INTRAMUSCULAR | Status: DC | PRN

## 2020-06-03 MED ORDER — DIBUCAINE (PERIANAL) 1 % EX OINT: 1.0000 "application " | TOPICAL_OINTMENT | CUTANEOUS | Status: DC | PRN

## 2020-06-03 MED ORDER — LACTATED RINGERS IV SOLN: 500.0000 mL | Freq: Once | INTRAVENOUS | Status: DC

## 2020-06-03 MED ORDER — FENTANYL 2.5 MCG/ML W/ROPIVACAINE 0.15% IN NS 100 ML EPIDURAL (ARMC)
12.0000 mL/h | EPIDURAL | Status: DC
Start: 2020-06-03 — End: 2020-06-03

## 2020-06-03 MED ORDER — EPHEDRINE 5 MG/ML INJ
10.0000 mg | INTRAVENOUS | Status: DC | PRN
  Administered 2020-06-03: 10 mg via INTRAVENOUS
  Filled 2020-06-03: qty 4
  Filled 2020-06-03: qty 2

## 2020-06-03 MED ORDER — PHENYLEPHRINE 40 MCG/ML (10ML) SYRINGE FOR IV PUSH (FOR BLOOD PRESSURE SUPPORT)
80.0000 ug | PREFILLED_SYRINGE | INTRAVENOUS | Status: DC | PRN
  Filled 2020-06-03: qty 10

## 2020-06-03 MED ORDER — DOCUSATE SODIUM 100 MG PO CAPS
100.0000 mg | ORAL_CAPSULE | Freq: Two times a day (BID) | ORAL | Status: DC
  Administered 2020-06-04: 100 mg via ORAL
  Filled 2020-06-03: qty 1

## 2020-06-03 MED ORDER — ZOLPIDEM TARTRATE 5 MG PO TABS: 5.0000 mg | ORAL_TABLET | Freq: Every evening | ORAL | Status: DC | PRN

## 2020-06-03 MED ORDER — COCONUT OIL OIL
1.0000 "application " | TOPICAL_OIL | Status: DC | PRN
  Administered 2020-06-04: 1 via TOPICAL
  Filled 2020-06-03: qty 120

## 2020-06-03 MED ORDER — IBUPROFEN 600 MG PO TABS
600.0000 mg | ORAL_TABLET | Freq: Four times a day (QID) | ORAL | Status: DC
  Administered 2020-06-03 – 2020-06-04 (×6): 600 mg via ORAL
  Filled 2020-06-03 (×4): qty 1

## 2020-06-03 MED ORDER — FENTANYL 2.5 MCG/ML W/ROPIVACAINE 0.15% IN NS 100 ML EPIDURAL (ARMC)
EPIDURAL | Status: DC | PRN
  Administered 2020-06-03: 12 mL/h via EPIDURAL

## 2020-06-03 MED ORDER — BENZOCAINE-MENTHOL 20-0.5 % EX AERO: 1.0000 "application " | INHALATION_SPRAY | CUTANEOUS | Status: DC | PRN

## 2020-06-03 MED ORDER — EPHEDRINE 5 MG/ML INJ
10.0000 mg | INTRAVENOUS | Status: DC | PRN
  Filled 2020-06-03: qty 2

## 2020-06-03 MED ORDER — LIDOCAINE HCL (PF) 1 % IJ SOLN
INTRAMUSCULAR | Status: DC | PRN
  Administered 2020-06-03: 1.5 mL via SUBCUTANEOUS

## 2020-06-03 NOTE — Progress Notes (Signed)
   Subjective:  Doing well postpartum 3 hours day 0: she is waiting for breakfast, her pain is controlled with PO medication, she is ambulating and voiding without difficulty. She reports breastfeeding is going well so far.  Objective:  Vital signs in last 24 hours: Temp:  [98.1 F (36.7 C)-98.3 F (36.8 C)] 98.3 F (36.8 C) (03/10 0845) Pulse Rate:  [59-101] 62 (03/10 0845) Resp:  [14-17] 17 (03/10 0845) BP: (98-129)/(55-82) 115/64 (03/10 0845) SpO2:  [92 %-98 %] 93 % (03/10 0400) Weight:  [80.7 kg] 80.7 kg (03/09 1541)    General: NAD Pulmonary: no increased work of breathing Abdomen: non-distended, non-tender, fundus firm at level of umbilicus Extremities: no edema, no erythema, no tenderness  Results for orders placed or performed during the hospital encounter of 06/02/20 (from the past 72 hour(s))  CBC     Status: Abnormal   Collection Time: 06/02/20  3:57 PM  Result Value Ref Range   WBC 11.8 (H) 4.0 - 10.5 K/uL   RBC 3.87 3.87 - 5.11 MIL/uL   Hemoglobin 11.4 (L) 12.0 - 15.0 g/dL   HCT 16.1 (L) 09.6 - 04.5 %   MCV 89.1 80.0 - 100.0 fL   MCH 29.5 26.0 - 34.0 pg   MCHC 33.0 30.0 - 36.0 g/dL   RDW 40.9 81.1 - 91.4 %   Platelets 223 150 - 400 K/uL   nRBC 0.0 0.0 - 0.2 %    Comment: Performed at [facility], [address], [place], [place] [address]  ABO/Rh     Status: None   Collection Time: 06/02/20  3:57 PM  Result Value Ref Range   ABO/RH(D)      O POS Performed at [facility], [address] [place], [place] [address]   Type and screen     Status: None   Collection Time: 06/02/20  4:34 PM  Result Value Ref Range   ABO/RH(D) O POS    Antibody Screen NEG    Sample Expiration      06/05/2020,2359 Performed at [facility], [address], [place], [place] [address]     Assessment:   20 y.o. G1P1001 postpartum day # 0, lactating  Plan:    1) Acute blood loss anemia - hemodynamically stable and asymptomatic - po ferrous  sulfate  2) Blood Type --/--/O POS (03/09 1634) / Rubella 4.26 (08/02 1212) / Varicella Non-Immune  3) TDAP status declines  4) Feeding plan breast  5)  Education given regarding options for contraception, as well as compatibility with breast feeding if applicable.  Patient plans on oral progesterone-only contraceptive for contraception.  6) Disposition: continue current care   Tresea Mall, CNM [facility] 06/03/2020, 10:05 AM

## 2020-06-03 NOTE — Anesthesia Procedure Notes (Signed)
Epidural Patient location during procedure: OB Start time: 06/03/2020 12:10 AM End time: 06/03/2020 12:35 AM  Staffing Performed: anesthesiologist   Preanesthetic Checklist Completed: patient identified, IV checked, site marked, risks and benefits discussed, surgical consent, monitors and equipment checked, pre-op evaluation and timeout performed  Epidural Patient position: sitting Prep: Betadine Patient monitoring: heart rate, continuous pulse ox and blood pressure Approach: midline Location: L4-L5 Injection technique: LOR saline  Needle:  Needle type: Tuohy  Needle gauge: 17 G Needle length: 9 cm and 9 Needle insertion depth: 7 cm Catheter type: closed end flexible Catheter size: 20 Guage Catheter at skin depth: 13 cm Test dose: negative and 1.5% lidocaine with Epi 1:200 K  Assessment Events: blood not aspirated, injection not painful, no injection resistance, no paresthesia and negative IV test  Additional Notes   Patient tolerated the insertion well without complications.Reason for block:procedure for pain

## 2020-06-03 NOTE — Anesthesia Preprocedure Evaluation (Signed)
Anesthesia Evaluation  Patient identified by MRN, date of birth, ID band Patient awake    Reviewed: Allergy & Precautions, NPO status , Patient's Chart, lab work & pertinent test results  History of Anesthesia Complications Negative for: history of anesthetic complications  Airway Mallampati: II       Dental   Pulmonary neg sleep apnea, neg COPD, Not current smoker,           Cardiovascular (-) hypertension(-) Past MI and (-) CHF (-) dysrhythmias (-) Valvular Problems/Murmurs     Neuro/Psych Seizures - (vaso-vagal reaction),     GI/Hepatic Neg liver ROS, GERD (with preganncy)  ,  Endo/Other  neg diabetes  Renal/GU negative Renal ROS     Musculoskeletal   Abdominal   Peds  Hematology   Anesthesia Other Findings   Reproductive/Obstetrics (+) Pregnancy                             Anesthesia Physical Anesthesia Plan  ASA: II  Anesthesia Plan: Epidural   Post-op Pain Management:    Induction:   PONV Risk Score and Plan:   Airway Management Planned:   Additional Equipment:   Intra-op Plan:   Post-operative Plan:   Informed Consent: I have reviewed the patients History and Physical, chart, labs and discussed the procedure including the risks, benefits and alternatives for the proposed anesthesia with the patient or authorized representative who has indicated his/her understanding and acceptance.       Plan Discussed with:   Anesthesia Plan Comments:         Anesthesia Quick Evaluation

## 2020-06-03 NOTE — Discharge Summary (Signed)
Postpartum Discharge Summary  Patient Name: Valerie Conner DOB: Jun 21, 2000 MRN: [id number]  Date of admission: 06/02/2020 Delivery date:06/03/2020  Delivering provider: Imagene Riches  Date of discharge: 06/04/2020  Admitting diagnosis: Pregnancy [Z34.90] Intrauterine pregnancy: [redacted]w[redacted]d    Secondary diagnosis:  Active Problems:   Post-dates pregnancy   Postpartum care following vaginal delivery   Vaginal delivery  Additional problems: none    Discharge diagnosis: Term Pregnancy Delivered                                              Post partum procedures:none Augmentation: AROM and Cytotec Complications: None  Hospital course: Induction of Labor With Vaginal Delivery   20y.o. yo G1P1001 at 438w1das admitted to the hospital 06/02/2020 for induction of labor.  Indication for induction: Postdates.  Patient had an uncomplicated labor course as follows: Membrane Rupture Time/Date: 9:57 PM ,06/02/2020   Delivery Method:Vaginal, Spontaneous  Episiotomy: None none Lacerations:  Bilateral labial lacerations repaired Details of delivery can be found in separate delivery note.  Patient had a routine postpartum course. Patient is discharged home 06/04/20.  Newborn Data: Birth date:06/03/2020  Birth time:5:04 AM  Gender:Female  Living status:Living  Apgars:8 ,9  Weight:3800 g   Magnesium Sulfate received: No BMZ received: No Rhophylac:No MMR:No  Varivax: declined on day of discharge T-DaP: declined Flu: No Transfusion:No  Physical exam  Vitals:   06/03/20 1649 06/03/20 1947 06/03/20 2307 06/04/20 0734  BP: 111/77 116/82 118/81 119/84  Pulse: 68 80 72 71  Resp: '20 16 16 18  ' Temp: 97.9 F (36.6 C) 98 F (36.7 C) 97.8 F (36.6 C) 98.3 F (36.8 C)  TempSrc: Oral Oral Oral Oral  SpO2: 99% 99% 100% 100%  Weight:      Height:       General: alert, cooperative and no distress Lochia: appropriate Uterine Fundus: firm Incision: N/A DVT Evaluation: No evidence of DVT seen on  physical exam. No significant calf/ankle edema. Labs: Lab Results  Component Value Date   WBC 10.9 (H) 06/04/2020   HGB 10.3 (L) 06/04/2020   HCT 32.1 (L) 06/04/2020   MCV 90.4 06/04/2020   PLT 218 06/04/2020   CMP Latest Ref Rng & Units 11/24/2019  Glucose 65 - 99 mg/dL 77  BUN 6 - 20 mg/dL 7  Creatinine 0.57 - 1.00 mg/dL 0.52(L)  Sodium 134 - 144 mmol/L 137  Potassium 3.5 - 5.2 mmol/L 4.3  Chloride 96 - 106 mmol/L 104  CO2 20 - 29 mmol/L 20  Calcium 8.7 - 10.2 mg/dL 9.4  Total Protein 6.0 - 8.5 g/dL 6.4  Total Bilirubin 0.0 - 1.2 mg/dL 0.4  Alkaline Phos 45 - 106 IU/L 52  AST 0 - 40 IU/L 13  ALT 0 - 32 IU/L 13   Edinburgh Score: Edinburgh Postnatal Depression Scale Screening Tool 06/03/2020  I have been able to laugh and see the funny side of things. 0  I have looked forward with enjoyment to things. 0  I have blamed myself unnecessarily when things went wrong. 1  I have been anxious or worried for no good reason. 2  I have felt scared or panicky for no good reason. 1  Things have been getting on top of me. 1  I have been so unhappy that I have had difficulty sleeping. 0  I have felt sad or miserable. 1  I have been so unhappy that I have been crying. 1  The thought of harming myself has occurred to me. 0  Edinburgh Postnatal Depression Scale Total 7      After visit meds:  Allergies as of 06/04/2020   No Known Allergies     Medication List    STOP taking these medications   Butalbital-APAP-Caffeine 50-325-40 MG capsule     TAKE these medications   acetaminophen 325 MG tablet Commonly known as: Tylenol Take 2 tablets (650 mg total) by mouth every 6 (six) hours as needed (for pain scale < 4).   ibuprofen 600 MG tablet Commonly known as: ADVIL Take 1 tablet (600 mg total) by mouth every 6 (six) hours.   PRENATAL VITAMIN PO Take by mouth.        Discharge home in stable condition Infant Feeding: Breast Infant Disposition:home with mother Discharge  instruction: per After Visit Summary and Postpartum booklet. Activity: Advance as tolerated. Pelvic rest for 6 weeks.  Diet: routine diet Anticipated Birth Control: POPs Postpartum Appointment: 2 weeks  And 6 weeks Additional Postpartum F/U: Postpartum Depression checkup at 2 weeks postpartum Future Appointments:No future appointments. Follow up Visit:  Follow-up Information    Imagene Riches, CNM. Schedule an appointment as soon as possible for a visit in 2 week(s).   Specialties: Obstetrics, Gynecology Why: Please call the [facility] office and make an appointment for 2 weeks post delivery and again in 6 weeks after delivery for a postpartum physical. Contact information: [address] [place] [address] [phone]                Orlie Pollen, CNM, MSN [place], [facility] 06/04/20 09:48 AM

## 2020-06-03 NOTE — Progress Notes (Signed)
Valerie Conner is a 20 y.o. G1P0 at [redacted]w[redacted]d by ultrasound admitted for induction of labor due to Post dates. Due date 05/26/2020.  Subjective:  Now comfortable with an epidural. Recently SROMed- clear fluid seen   Objective: BP 118/76   Pulse 85   Temp 98.2 F (36.8 C) (Oral)   Resp 14   Ht 5\' 9"  (1.753 m)   Wt 80.7 kg   LMP 08/20/2019   SpO2 93%   BMI 26.29 kg/m  I/O last 3 completed shifts: In: 125.9 [I.V.:125.9] Out: -  No intake/output data recorded.  FHT:  FHR: 135  bpm, variability: moderate,  accelerations:  Present,  decelerations:  Absent UC:   regular, every 3 minutes SVE:   Dilation: 5 Effacement (%): 100 Station: -1 Exam by:: JDaley  Labs: Lab Results  Component Value Date   WBC 11.8 (H) 06/02/2020   HGB 11.4 (L) 06/02/2020   HCT 34.5 (L) 06/02/2020   MCV 89.1 06/02/2020   PLT 223 06/02/2020    Assessment / Plan: Induction of labor due to postterm,  progressing well on pitocin  Labor: Progressing normally  Fetal Wellbeing:  Category I Pain Control:  Epidural I/D:  n/a Anticipated MOD:  NSVD  08/02/2020 06/03/2020, 4:17 AM

## 2020-06-03 NOTE — Lactation Note (Signed)
This note was copied from a baby's chart. Lactation Consultation Note  Patient Name: Valerie Conner [id number] Date: 06/03/2020 Reason for consult: Initial assessment;1st time breastfeeding;Primapara;Term Age:20 hours  Initial lactation visit for first time mom who delivered 6hrs ago vaginally.  LC in room, baby spitty/gaggy in bassinet. LC sat baby up and educated mom on how to support baby when he is doing this. LC found blue bulb and helped pull mucous from mouth, encouraged putting baby skin to skin on her chest up right to help with digestion, but mom left baby wrapped in blanket.  In speaking with mom, mom indicates that the first breastfeeding session went "how she expected", no further detail given. LC attempted to retrieve more information, but unable to gather further details.  LC reviewed newborn feeding patterns, behaviors, impact that spitty/gaggy can have on feeding, early feeding cues, and output expectations.  Encouraged to call with questions or for support with feeding. Mom agreed.  Maternal Data Has patient been taught Hand Expression?: No (declines to be taught at this time) Does the patient have breastfeeding experience prior to this delivery?: No  Feeding Mother's Current Feeding Choice: Breast Milk  LATCH Score                    Lactation Tools Discussed/Used    Interventions Interventions: Breast feeding basics reviewed;Education (assisted with spitty/gaggy episode)  Discharge Pump: Personal  Consult Status Consult Status: Follow-up Date: 06/03/20 Follow-up type: In-patient    Danford Bad 06/03/2020, 11:35 AM

## 2020-06-04 LAB — CBC
HCT: 32.1 % — ABNORMAL LOW (ref 36.0–46.0)
Hemoglobin: 10.3 g/dL — ABNORMAL LOW (ref 12.0–15.0)
MCH: 29 pg (ref 26.0–34.0)
MCHC: 32.1 g/dL (ref 30.0–36.0)
MCV: 90.4 fL (ref 80.0–100.0)
Platelets: 218 10*3/uL (ref 150–400)
RBC: 3.55 MIL/uL — ABNORMAL LOW (ref 3.87–5.11)
RDW: 13.6 % (ref 11.5–15.5)
WBC: 10.9 10*3/uL — ABNORMAL HIGH (ref 4.0–10.5)
nRBC: 0 % (ref 0.0–0.2)

## 2020-06-04 MED ORDER — ACETAMINOPHEN 325 MG PO TABS: 650.0000 mg | ORAL_TABLET | Freq: Four times a day (QID) | ORAL | Status: DC | PRN

## 2020-06-04 MED ORDER — IBUPROFEN 600 MG PO TABS: 600.0000 mg | ORAL_TABLET | Freq: Four times a day (QID) | ORAL | 0 refills | Status: DC

## 2020-06-04 NOTE — Lactation Note (Addendum)
This note was copied from a baby's chart. Lactation Consultation Note  Patient Name: Valerie Conner [id number] Date: 06/04/2020 Reason for consult: Follow-up assessment Age:20 hours  Maternal Data Has patient been taught Hand Expression?: Yes Does the patient have breastfeeding experience prior to this delivery?: No  Feeding Mother's Current Feeding Choice: Breast Milk Mom nursing baby, assisted mom with positioning and widening latch, swallows noted LATCH Score Latch: Grasps breast easily, tongue down, lips flanged, rhythmical sucking.  Audible Swallowing: Spontaneous and intermittent  Type of Nipple: Everted at rest and after stimulation  Comfort (Breast/Nipple): Filling, red/small blisters or bruises, mild/mod discomfort  Hold (Positioning): Assistance needed to correctly position infant at breast and maintain latch.  LATCH Score: 8   Lactation Tools Discussed/Used  LC name and no written on white board, reviewed lactation information in baby booklet  Interventions Interventions: Breast feeding basics reviewed;Assisted with latch;Skin to skin;Adjust position;Support pillows;Coconut oil  Discharge Discharge Education: Engorgement and breast care;Warning signs for feeding baby Pump: Personal (will get a pump on Monday, 06/07/2020) WIC Program: Yes  Consult Status Consult Status: Complete Date: 06/04/20 Follow-up type: In-patient    Dyann Kief 06/04/2020, 10:58 AM

## 2020-06-04 NOTE — Progress Notes (Signed)
Discharge order received from doctor. Varicella vaccine offered at discharge. Pt refused varicella vaccine. Reviewed discharge instructions and medications with patient and answered all questions. Follow up appointment instructions given. Patient verbalized understanding. ID bands checked. Patient discharged home with infant via wheelchair by nursing/auxillary.    Inge Rise, RN

## 2020-06-04 NOTE — Discharge Instructions (Signed)

## 2020-06-04 NOTE — Anesthesia Postprocedure Evaluation (Signed)
Anesthesia Post Note  Patient: Valerie Conner  Procedure(s) Performed: AN AD HOC LABOR EPIDURAL  Patient location during evaluation: Mother Baby Anesthesia Type: Epidural Level of consciousness: awake and alert Pain management: pain level controlled Vital Signs Assessment: post-procedure vital signs reviewed and stable Respiratory status: spontaneous breathing, nonlabored ventilation and respiratory function stable Cardiovascular status: stable Postop Assessment: no headache, no backache and epidural receding Anesthetic complications: no   No complications documented.   Last Vitals:  Vitals:   06/03/20 2307 06/04/20 0734  BP: 118/81 119/84  Pulse: 72 71  Resp: 16 18  Temp: 36.6 C 36.8 C  SpO2: 100% 100%    Last Pain:  Vitals:   06/04/20 0734  TempSrc: Oral  PainSc:                  Karoline Caldwell

## 2020-06-14 NOTE — H&P (Signed)
History and Physical Interval Note:  06/02/2020 1:37 PM  Valerie Conner  has presented today for INDUCTION OF LABOR (cervical ripening agents),  with the diagnosis of Favorable cervix at term. The various methods of treatment have been discussed with the patient and family. After consideration of risks, benefits and other options for treatment, the patient has consented to  Labor induction .  The patient's history has been reviewed, patient examined, no change in status, and is stable for induction as planned.  See H&P. I have reviewed the patient's chart and labs.  Questions were answered to the patient's satisfaction.    Mirna Mires, CNM  06/14/2020 1:44 PM   [facility], [facility] 06/14/2020  1:37 PM

## 2020-06-18 ENCOUNTER — Encounter: Payer: Self-pay | Admitting: Obstetrics

## 2020-06-18 ENCOUNTER — Other Ambulatory Visit: Payer: Self-pay

## 2020-06-18 ENCOUNTER — Ambulatory Visit (INDEPENDENT_AMBULATORY_CARE_PROVIDER_SITE_OTHER): Payer: Medicaid Other | Admitting: Obstetrics

## 2020-06-18 DIAGNOSIS — G43909 Migraine, unspecified, not intractable, without status migrainosus: Secondary | ICD-10-CM | POA: Insufficient documentation

## 2020-06-18 DIAGNOSIS — G43019 Migraine without aura, intractable, without status migrainosus: Secondary | ICD-10-CM

## 2020-06-18 NOTE — Progress Notes (Signed)
Obstetrics & Gynecology Office Visit   Chief Complaint:  Chief Complaint  Patient presents with  . Blood Pressure Check    No concerns    History of Present Illness:  Valerie Conner presents at two weeks postpartum for a blood pressure check. She had an episode of severe headaches several days ago, and called this provider for assistance after hours. Her symptoms were suspicious for migraine, and after taking several home BP measures that were in normal range, she took several Fioricets and got relief. Today she reports no further headaches, visual changes or swelling.   Review of Systems:  Review of Systems  Constitutional: Negative.   HENT: Negative.   Eyes: Negative.   Cardiovascular: Negative.   Gastrointestinal: Negative.   Genitourinary: Negative.   Musculoskeletal: Negative.   Skin: Negative.   Neurological: Negative.   Endo/Heme/Allergies: Negative.   Psychiatric/Behavioral: Negative.      Past Medical History:  Past Medical History:  Diagnosis Date  . GERD (gastroesophageal reflux disease)   . Headache     Past Surgical History:  History reviewed. No pertinent surgical history.  Gynecologic History: No LMP recorded.  Obstetric History: G1P1001  Family History:  History reviewed. No pertinent family history.  Social History:  Social History   Socioeconomic History  . Marital status: Single    Spouse name: Not on file  . Number of children: Not on file  . Years of education: Not on file  . Highest education level: Not on file  Occupational History  . Not on file  Tobacco Use  . Smoking status: Never Smoker  . Smokeless tobacco: Never Used  Vaping Use  . Vaping Use: Never used  Substance and Sexual Activity  . Alcohol use: No  . Drug use: Never  . Sexual activity: Yes    Birth control/protection: None    Comment: plan PP is Pill  Other Topics Concern  . Not on file  Social History Narrative   Right handed    Lives with family    Social  Determinants of Health   Financial Resource Strain: Not on file  Food Insecurity: Not on file  Transportation Needs: Not on file  Physical Activity: Not on file  Stress: Not on file  Social Connections: Not on file  Intimate Partner Violence: Not on file    Allergies:  No Known Allergies  Medications: Prior to Admission medications   Medication Sig Start Date End Date Taking? Authorizing Provider  acetaminophen (TYLENOL) 325 MG tablet Take 2 tablets (650 mg total) by mouth every 6 (six) hours as needed (for pain scale < 4). 06/04/20  Yes Veal, Katelyn, CNM  ibuprofen (ADVIL) 600 MG tablet Take 1 tablet (600 mg total) by mouth every 6 (six) hours. 06/04/20  Yes Zipporah Plants, CNM  Prenatal Vit-Fe Fumarate-FA (PRENATAL VITAMIN PO) Take by mouth.   Yes [provider]    Physical Exam Vitals:  Vitals:   06/18/20 1013  BP: 90/60   No LMP recorded.  Physical Exam Vitals reviewed.  Constitutional:      Appearance: Normal appearance.  HENT:     Head: Normocephalic and atraumatic.  Cardiovascular:     Rate and Rhythm: Normal rate and regular rhythm.  Pulmonary:     Effort: Pulmonary effort is normal.     Breath sounds: Normal breath sounds.  Musculoskeletal:     Cervical back: Normal range of motion and neck supple.  Neurological:     General: No focal deficit present.  Mental Status: She is alert and oriented to person, place, and time.  Psychiatric:        Mood and Affect: Mood normal.        Behavior: Behavior normal.      Assessment: 20 y.o. G1P1001  Status post migraine headache Normotensive   Plan: Problem List Items Addressed This Visit      Cardiovascular and Mediastinum   Migraine headache    Other Visit Diagnoses    Postpartum care and examination    -  Primary     Her BP is reassuring today. She will use the Fioricet sparingly if another headache occurs.  Follow up in 4 weeks for her 6 week PP physical.She is undecided re Contraception-  Additional time spent today reviewing her options for [facility]. A total of 20 minutes is spent providing patient care. Mirna Mires, CNM  06/18/2020 5:18 PM

## 2020-07-19 ENCOUNTER — Other Ambulatory Visit: Payer: Self-pay

## 2020-07-19 ENCOUNTER — Encounter: Payer: Self-pay | Admitting: Obstetrics

## 2020-07-19 ENCOUNTER — Ambulatory Visit (INDEPENDENT_AMBULATORY_CARE_PROVIDER_SITE_OTHER): Payer: Medicaid Other | Admitting: Obstetrics

## 2020-07-19 DIAGNOSIS — Z30430 Encounter for insertion of intrauterine contraceptive device: Secondary | ICD-10-CM

## 2020-07-19 NOTE — Progress Notes (Signed)
Postpartum Visit  Chief Complaint:  Chief Complaint  Patient presents with  . Postpartum Care  . Contraception    Interested in IUD    History of Present Illness: Patient is a 20 y.o. G1P1001 presents for postpartum visit.  Date of delivery: 06/03/2020 Type of delivery: Vaginal delivery - Vacuum or forceps assisted  no Episiotomy No.  Laceration: bilateral labial lacerations  Pregnancy or labor problems:  no Any problems since the delivery:  Yes. She bruised her tailbone, and it still bothers her.   Newborn Details:  SINGLETON :  1. Baby's name: Valerie Conner . Birth weight: 8lbs 6 oz. Maternal Details:  Breast Feeding:  no Post partum depression/anxiety noted:  no Edinburgh Post-Partum Depression Score:  8  Date of last PAP: NA    Past Medical History:  Diagnosis Date  . GERD (gastroesophageal reflux disease)   . Headache     History reviewed. No pertinent surgical history.  Prior to Admission medications   Medication Sig Start Date End Date Taking? Authorizing Provider  Prenatal Vit-Fe Fumarate-FA (PRENATAL VITAMIN PO) Take by mouth.   Yes [provider]    No Known Allergies   Social History   Socioeconomic History  . Marital status: Single    Spouse name: Not on file  . Number of children: Not on file  . Years of education: Not on file  . Highest education level: Not on file  Occupational History  . Not on file  Tobacco Use  . Smoking status: Never Smoker  . Smokeless tobacco: Never Used  Vaping Use  . Vaping Use: Never used  Substance and Sexual Activity  . Alcohol use: No  . Drug use: Never  . Sexual activity: Yes    Birth control/protection: None  Other Topics Concern  . Not on file  Social History Narrative   Right handed    Lives with family    Social Determinants of Health   Financial Resource Strain: Not on file  Food Insecurity: Not on file  Transportation Needs: Not on file  Physical Activity: Not on file  Stress: Not on file   Social Connections: Not on file  Intimate Partner Violence: Not on file    History reviewed. No pertinent family history.  ROS   Physical Exam BP 100/66   Ht 5\' 9"  (1.753 m)   Wt 161 lb (73 kg)   LMP 07/16/2020 (Exact Date)   Breastfeeding No   BMI 23.78 kg/m   OBGyn Exam   Female Chaperone present during breast and/or pelvic exam.  Assessment: 20 y.o. G1P1001 presenting for 6 week postpartum visit  Plan: Problem List Items Addressed This Visit   None   Visit Diagnoses    Postpartum care and examination    -  Primary       1) Contraception Education given regarding options for contraception, including IUD placement.  2)  Pap - ASCCP guidelines and rational discussed.  Patient opts for annual screening interval once she turns 21  3) Patient underwent screening for postpartum depression with no concerns noted.  4) Follow up 1 year for routine annual exam  12, CNM  07/19/2020 12:01 PM      GYNECOLOGY OFFICE PROCEDURE NOTE  Valerie Conner is a 20 y.o. G1P1001 here for both her poastpartum physical and a Mirena  IUD insertion. No GYN concerns.  Last pap smear was on NA .The patient is currently using nothing for contraception and her LMP is Patient's last menstrual period  was 07/16/2020 (exact date)..  The indication for her IUD is contraception/cycle control.  IUD Insertion Procedure Note Patient identified, informed consent performed, consent signed.   Discussed risks of irregular bleeding, cramping, infection, malpositioning, expulsion or uterine perforation of the IUD (1:1000 placements)  which may require further procedure such as laparoscopy.  IUD while effective at preventing pregnancy do not prevent transmission of sexually transmitted diseases and use of barrier methods for this purpose was discussed. Time out was performed.  Urine pregnancy test negative.  Speculum placed in the vagina.  Cervix visualized.  Cleaned with Betadine x 2.  Grasped  anteriorly with a single tooth tenaculum.  Uterus sounded to 7.5 cm. IUD placed per manufacturer's recommendations.  Strings trimmed to 3 cm. Tenaculum was removed, good hemostasis noted.  Patient tolerated procedure well.   Patient was given post-procedure instructions.  She was advised to have backup contraception for one week.  Patient was also asked to check IUD strings periodically and follow up in 6 weeks for IUD check.  IUD insertion CPT 58300,  Skyla [id number] Mirena [id number] Liletta J7297 Paraguard J7300 Rutha Bouchard Z6109 Modifer 25, plus Modifer 79 is done during a global billing visit  Mirna Mires, CNM  07/19/2020 12:03 PM   [facility], [facility]  07/19/2020 11:55 AM

## 2020-08-02 ENCOUNTER — Telehealth: Payer: Self-pay

## 2020-08-02 NOTE — Telephone Encounter (Signed)
Pt calling; is on 1st period since delivery; x2wks; it will stop for a day then start back the next day.  [phone]

## 2020-08-02 NOTE — Telephone Encounter (Signed)
Pt states del 3/10th; period started 4/22; has had IUD inserted.  Adv body is trying to adjust to not being preg and to the IUD; it takes a good three months for body to adjust to bc; adv irreg bleeding is to be expected; to try to bare with it.

## 2020-08-18 ENCOUNTER — Encounter: Payer: Self-pay | Admitting: Obstetrics

## 2020-08-18 ENCOUNTER — Other Ambulatory Visit: Payer: Self-pay

## 2020-08-18 ENCOUNTER — Ambulatory Visit (INDEPENDENT_AMBULATORY_CARE_PROVIDER_SITE_OTHER): Payer: Medicaid Other | Admitting: Obstetrics

## 2020-08-18 VITALS — BP 120/70 | Ht 69.0 in | Wt 164.0 lb

## 2020-08-18 DIAGNOSIS — Z7251 High risk heterosexual behavior: Secondary | ICD-10-CM | POA: Diagnosis not present

## 2020-08-18 DIAGNOSIS — Z30431 Encounter for routine checking of intrauterine contraceptive device: Secondary | ICD-10-CM

## 2020-08-18 LAB — POCT URINE PREGNANCY: Preg Test, Ur: NEGATIVE

## 2020-08-18 NOTE — Progress Notes (Signed)
   IUD String Check  Subjctive: [name] presents for IUD string check.  She had a Mirena placed 4 weeks ago.  Since placement of her IUD she had irregular vaginal bleeding.  She denies cramping or discomfort.  She has had intercourse since placement.  She has not checked the strings.  She denies any fever, chills, nausea, vomiting, or other complaints.  She is bothered by the ongoing light spotting. Her partner feels something when they have intercourse, and she is worried that he is feeling the IUD.  Objective: BP 120/70   Ht 5\' 9"  (1.753 m)   Wt 164 lb (74.4 kg)   LMP 07/16/2020   BMI 24.22 kg/m  Physical Exam Constitutional:      Appearance: Normal appearance. She is normal weight.  Genitourinary:     Vulva normal.     Genitourinary Comments: Speculum exam reveals two IUD strings protruding 2 cms from the cervical os. No visible IUD. Uterus is anteverted, non tender.     Vaginal discharge present.  HENT:     Head: Normocephalic and atraumatic.     Nose: Nose normal.  Cardiovascular:     Rate and Rhythm: Normal rate and regular rhythm.  Pulmonary:     Effort: Pulmonary effort is normal.     Breath sounds: Normal breath sounds.  Abdominal:     General: Abdomen is flat.     Palpations: Abdomen is soft.  Musculoskeletal:        General: Normal range of motion.     Cervical back: Normal range of motion and neck supple.  Neurological:     General: No focal deficit present.     Mental Status: She is alert and oriented to person, place, and time.  Skin:    General: Skin is warm and dry.  Psychiatric:        Mood and Affect: Mood normal.        Behavior: Behavior normal.     Female chaperone was present for the entirety of the pelvic exam  Assessment: 20 y.o. year old female status post prior Mirena IUD placement 4 week ago, doing well.  Plan: 1.  The patient was given instructions to check her IUD strings monthly and call with any problems or concerns.  She  should call for fevers, chills, abnormal vaginal discharge, pelvic pain, or other complaints. One pack of low dosage pills given to her to use if she continues to have daily spotting for several more weeks. 2.  She will return for a annual exam in 1 year.  All questions answered.  20 minutes spent in face to face discussion with > 50% spent in counseling, management, and coordination of care for her newly-placed IUD.  Risks and benefits of IUD discussed including the risks of irregular bleeding, cramping, infection, malpositioning, expulsion, which may require further procedures such as laparoscopy.  IUDs while effective at preventing pregnancy do not prevent transmission of sexually transmitted diseases and use of barrier methods for this purpose was discussed.  Low overall incidence of failure with 99.7% efficacy rate in typical use.    12, CNM  08/18/2020 11:27 AM   08/18/2020 11:22 AM

## 2020-09-15 ENCOUNTER — Encounter: Payer: Self-pay | Admitting: Obstetrics and Gynecology

## 2020-09-15 ENCOUNTER — Ambulatory Visit (INDEPENDENT_AMBULATORY_CARE_PROVIDER_SITE_OTHER): Payer: Medicaid Other | Admitting: Obstetrics and Gynecology

## 2020-09-15 ENCOUNTER — Other Ambulatory Visit: Payer: Self-pay

## 2020-09-15 VITALS — BP 100/60 | Ht 69.0 in | Wt 166.0 lb

## 2020-09-15 DIAGNOSIS — N3001 Acute cystitis with hematuria: Secondary | ICD-10-CM

## 2020-09-15 LAB — POCT URINALYSIS DIPSTICK
Bilirubin, UA: NEGATIVE
Glucose, UA: NEGATIVE
Ketones, UA: POSITIVE
Nitrite, UA: POSITIVE
Protein, UA: NEGATIVE
Spec Grav, UA: 1.02 (ref 1.010–1.025)
pH, UA: 5 (ref 5.0–8.0)

## 2020-09-15 MED ORDER — CIPROFLOXACIN HCL 500 MG PO TABS: 500.0000 mg | ORAL_TABLET | Freq: Two times a day (BID) | ORAL | 0 refills | Status: AC

## 2020-09-15 NOTE — Progress Notes (Signed)
Erick Colace, MD   Chief Complaint  Patient presents with   Urinary Tract Infection    Frequency and burning urinating, pelvic and back pain x 5 days    HPI:      [name] is a 20 y.o. G1P1001 whose LMP was Patient's last menstrual period was 08/30/2020 (exact date)., presents today for urinary frequency, urgency, dysuria, and urine odor for past 5 days, no hematuria/pelvic pain. Started LBP yesterday and noted a low grade temp 2 days ago. Hx of UTIs in past. No recent abx use, no vag sx. Has IUD, doing well. No vag bleeding currently.    Past Medical History:  Diagnosis Date   GERD (gastroesophageal reflux disease)    Headache     History reviewed. No pertinent surgical history.  History reviewed. No pertinent family history.  Social History   Socioeconomic History   Marital status: Single    Spouse name: Not on file   Number of children: Not on file   Years of education: Not on file   Highest education level: Not on file  Occupational History   Not on file  Tobacco Use   Smoking status: Never   Smokeless tobacco: Never  Vaping Use   Vaping Use: Never used  Substance and Sexual Activity   Alcohol use: No   Drug use: Never   Sexual activity: Yes    Birth control/protection: I.U.D.    Comment: Mirena  Other Topics Concern   Not on file  Social History Narrative   Right handed    Lives with family    Social Determinants of Health   Financial Resource Strain: Not on file  Food Insecurity: Not on file  Transportation Needs: Not on file  Physical Activity: Not on file  Stress: Not on file  Social Connections: Not on file  Intimate Partner Violence: Not on file    Outpatient Medications Prior to Visit  Medication Sig Dispense Refill   levonorgestrel (MIRENA) 20 MCG/DAY IUD 1 each by Intrauterine route once.     Prenatal Vit-Fe Fumarate-FA (PRENATAL VITAMIN PO) Take by mouth. (Patient not taking: Reported on 08/18/2020)     No  facility-administered medications prior to visit.      ROS:  Review of Systems  Constitutional:  Negative for fever.  Gastrointestinal:  Negative for blood in stool, constipation, diarrhea, nausea and vomiting.  Genitourinary:  Positive for dysuria, frequency and urgency. Negative for dyspareunia, flank pain, hematuria, vaginal bleeding, vaginal discharge and vaginal pain.  Musculoskeletal:  Positive for back pain.  Skin:  Negative for rash.  BREAST: No symptoms   OBJECTIVE:   Vitals:  BP 100/60   Ht 5\' 9"  (1.753 m)   Wt 166 lb (75.3 kg)   LMP 08/30/2020 (Exact Date)   BMI 24.51 kg/m   Physical Exam Vitals reviewed.  Constitutional:      Appearance: She is well-developed. She is not ill-appearing or toxic-appearing.  Pulmonary:     Effort: Pulmonary effort is normal.  Abdominal:     Tenderness: There is right CVA tenderness and left CVA tenderness.  Musculoskeletal:        General: Normal range of motion.     Cervical back: Normal range of motion.  Neurological:     General: No focal deficit present.     Mental Status: She is alert and oriented to person, place, and time.     Cranial Nerves: No cranial nerve deficit.  Psychiatric:  Behavior: Behavior normal.        Thought Content: Thought content normal.        Judgment: Judgment normal.    Results: Results for orders placed or performed in visit on 09/15/20 (from the past 24 hour(s))  POCT Urinalysis Dipstick     Status: Abnormal   Collection Time: 09/15/20  5:01 PM  Result Value Ref Range   Color, UA yellow    Clarity, UA hazy    Glucose, UA Negative Negative   Bilirubin, UA neg    Ketones, UA pos    Spec Grav, UA 1.020 1.010 - 1.025   Blood, UA small    pH, UA 5.0 5.0 - 8.0   Protein, UA Negative Negative   Urobilinogen, UA     Nitrite, UA pos    Leukocytes, UA Moderate (2+) (A) Negative   Appearance     Odor       Assessment/Plan: Acute cystitis with hematuria - Plan: POCT Urinalysis  Dipstick, ciprofloxacin (CIPRO) 500 MG tablet, Urine Culture; pos sx, UA, and mild CVAT. Rx cipro, check C&S. Pt to f/u if fevers/worsening LBP for pyelo eval. NSAIDs/increase water.     Meds ordered this encounter  Medications   ciprofloxacin (CIPRO) 500 MG tablet    Sig: Take 1 tablet (500 mg total) by mouth 2 (two) times daily for 5 days.    Dispense:  10 tablet    Refill:  0    Order Specific Question:   Supervising Provider    Answer:   Nadara Mustard [[id number]]      Return if symptoms worsen or fail to improve.  Janeli Lewison B. Jill Stopka, PA-C 09/15/2020 5:03 PM

## 2020-09-19 LAB — URINE CULTURE

## 2020-09-23 ENCOUNTER — Ambulatory Visit: Payer: Medicaid Other | Admitting: Obstetrics

## 2020-10-01 ENCOUNTER — Encounter: Payer: Medicaid Other | Admitting: Obstetrics

## 2020-10-01 ENCOUNTER — Other Ambulatory Visit: Payer: Self-pay

## 2020-10-01 ENCOUNTER — Encounter: Payer: Self-pay | Admitting: Obstetrics

## 2020-10-11 ENCOUNTER — Ambulatory Visit (INDEPENDENT_AMBULATORY_CARE_PROVIDER_SITE_OTHER): Payer: Medicaid Other | Admitting: Obstetrics

## 2020-10-11 ENCOUNTER — Other Ambulatory Visit: Payer: Self-pay

## 2020-10-11 ENCOUNTER — Encounter: Payer: Self-pay | Admitting: Obstetrics

## 2020-10-11 VITALS — BP 110/68 | Ht 69.0 in | Wt 167.0 lb

## 2020-10-11 DIAGNOSIS — K629 Disease of anus and rectum, unspecified: Secondary | ICD-10-CM | POA: Diagnosis not present

## 2020-10-11 DIAGNOSIS — N949 Unspecified condition associated with female genital organs and menstrual cycle: Secondary | ICD-10-CM | POA: Diagnosis not present

## 2020-10-11 DIAGNOSIS — Z113 Encounter for screening for infections with a predominantly sexual mode of transmission: Secondary | ICD-10-CM | POA: Diagnosis not present

## 2020-10-11 MED ORDER — VALACYCLOVIR HCL 1 G PO TABS: 1000.0000 mg | ORAL_TABLET | Freq: Two times a day (BID) | ORAL | 0 refills | Status: DC

## 2020-10-11 NOTE — Progress Notes (Addendum)
Obstetrics & Gynecology Office Visit   Chief Complaint:  Chief Complaint  Patient presents with   Gynecologic Exam    ? Cut or blister in vagina x1 wk. RM 5    History of Present Illness: Valerie Conner presents with a complaint of small painful lesion she noticed about two weeks ago, located at the base of her right labia and near her vaginal opening.She describes this as a very sore laceration that burned when she was urinated. The burning has resolved now.  She has had only one sexual partner. Valerie Conner  contracepts with a Mirena IUD.   Review of Systems:  Review of Systems  Constitutional: Negative.   HENT: Negative.    Eyes: Negative.   Respiratory: Negative.    Cardiovascular: Negative.   Gastrointestinal: Negative.   Genitourinary: Negative.   Skin:        Three small lacerations noted near the right base of the vagina.  Neurological: Negative.   Endo/Heme/Allergies: Negative.   Psychiatric/Behavioral: Negative.      Past Medical History:  Past Medical History:  Diagnosis Date   GERD (gastroesophageal reflux disease)    Headache     Past Surgical History:  No past surgical history on file.  Gynecologic History: No LMP recorded. (Menstrual status: IUD).  Obstetric History: G1P1001  Family History:  No family history on file.  Social History:  Social History   Socioeconomic History   Marital status: Single    Spouse name: Not on file   Number of children: Not on file   Years of education: Not on file   Highest education level: Not on file  Occupational History   Not on file  Tobacco Use   Smoking status: Never   Smokeless tobacco: Never  Vaping Use   Vaping Use: Never used  Substance and Sexual Activity   Alcohol use: No   Drug use: Never   Sexual activity: Yes    Birth control/protection: I.U.D.    Comment: Mirena  Other Topics Concern   Not on file  Social History Narrative   Right handed    Lives with family    Social Determinants of Health    Financial Resource Strain: Not on file  Food Insecurity: Not on file  Transportation Needs: Not on file  Physical Activity: Not on file  Stress: Not on file  Social Connections: Not on file  Intimate Partner Violence: Not on file    Allergies:  No Known Allergies  Medications: Prior to Admission medications   Medication Sig Start Date End Date Taking? Authorizing Provider  valACYclovir (VALTREX) 1000 MG tablet Take 1 tablet (1,000 mg total) by mouth 2 (two) times daily for 10 days. 10/11/20 10/21/20 Yes Mirna Mires, CNM  levonorgestrel (MIRENA) 20 MCG/DAY IUD 1 each by Intrauterine route once.    [provider]    Physical Exam Vitals:  Vitals:   10/11/20 1631  BP: 110/68   No LMP recorded. (Menstrual status: IUD).  Physical Exam Constitutional:      Appearance: She is normal weight.  Cardiovascular:     Rate and Rhythm: Normal rate and regular rhythm.  Pulmonary:     Effort: Pulmonary effort is normal.     Breath sounds: Normal breath sounds.  Genitourinary:    Labia:        Right: Lesion present.        Comments: See skin intake: small reddish surface lacerations located to the right of the introitus in the perineal  area. Musculoskeletal:        General: Normal range of motion.     Cervical back: Normal range of motion and neck supple.  Skin:    General: Skin is warm and dry.  Neurological:     General: No focal deficit present.     Mental Status: She is oriented to person, place, and time.  Psychiatric:        Mood and Affect: Mood normal.        Behavior: Behavior normal.     Assessment: 20 y.o. G1P 1 with small lesions noted near perineum on the right side   Plan: Problem List Items Addressed This Visit       Other   Perianal lesion   Other Visit Diagnoses     Genital lesion, female    -  Primary   Relevant Medications   valACYclovir (VALTREX) 1000 MG tablet   Other Relevant Orders   HSV(herpes smplx)abs-1+2(IgG+IgM)-bld      Will start treaemtn with Valtrex. Have ordered HSV blood testing which she will return and complete in several days (work conflict). Nuswab + sent as well. Discussed my suspicions that this may represent an initial HSV outbreak.  Mirna Mires, CNM  10/11/2020 5:32 PM

## 2020-10-13 ENCOUNTER — Other Ambulatory Visit: Payer: Medicaid Other

## 2020-10-13 ENCOUNTER — Other Ambulatory Visit: Payer: Self-pay

## 2020-10-13 DIAGNOSIS — N949 Unspecified condition associated with female genital organs and menstrual cycle: Secondary | ICD-10-CM

## 2020-10-14 LAB — NUSWAB VAGINITIS PLUS (VG+)
Candida albicans, NAA: NEGATIVE
Candida glabrata, NAA: NEGATIVE
Chlamydia trachomatis, NAA: NEGATIVE
Neisseria gonorrhoeae, NAA: NEGATIVE
Trich vag by NAA: NEGATIVE

## 2020-10-15 LAB — HSV(HERPES SMPLX)ABS-I+II(IGG+IGM)-BLD
HSV 1 Glycoprotein G Ab, IgG: 1.1 index — ABNORMAL HIGH (ref 0.00–0.90)
HSV 2 IgG, Type Spec: 5.55 index — ABNORMAL HIGH (ref 0.00–0.90)
HSVI/II Comb IgM: 3 Ratio — ABNORMAL HIGH (ref 0.00–0.90)

## 2020-10-18 NOTE — Progress Notes (Signed)
Called and spoke with patient about this new diagnosis. She is already taking Valtrex, and is offered suppressive therapy. Her baby's father is her only sexual partner, so she is advised to have him get testing and treatment as well. Discussed the importance of taking Valtrex during any future pregnancy. Mirna Mires, CNM  10/18/2020 4:12 PM

## 2021-03-08 ENCOUNTER — Other Ambulatory Visit: Payer: Self-pay | Admitting: Obstetrics

## 2021-03-08 DIAGNOSIS — N949 Unspecified condition associated with female genital organs and menstrual cycle: Secondary | ICD-10-CM

## 2022-01-23 ENCOUNTER — Encounter: Payer: Self-pay | Admitting: Obstetrics

## 2022-01-23 ENCOUNTER — Ambulatory Visit (INDEPENDENT_AMBULATORY_CARE_PROVIDER_SITE_OTHER): Payer: Self-pay | Admitting: Obstetrics

## 2022-01-23 VITALS — BP 108/72 | HR 86 | Ht 68.0 in | Wt 134.0 lb

## 2022-01-23 DIAGNOSIS — D649 Anemia, unspecified: Secondary | ICD-10-CM

## 2022-01-23 DIAGNOSIS — Z124 Encounter for screening for malignant neoplasm of cervix: Secondary | ICD-10-CM

## 2022-01-23 DIAGNOSIS — R42 Dizziness and giddiness: Secondary | ICD-10-CM

## 2022-01-23 DIAGNOSIS — B009 Herpesviral infection, unspecified: Secondary | ICD-10-CM

## 2022-01-23 DIAGNOSIS — Z01419 Encounter for gynecological examination (general) (routine) without abnormal findings: Secondary | ICD-10-CM

## 2022-01-23 DIAGNOSIS — R5382 Chronic fatigue, unspecified: Secondary | ICD-10-CM

## 2022-01-23 MED ORDER — VALACYCLOVIR HCL 500 MG PO TABS
500.0000 mg | ORAL_TABLET | Freq: Every day | ORAL | 11 refills | Status: DC
Start: 2022-01-23 — End: 2022-09-14

## 2022-01-24 LAB — CBC WITH DIFFERENTIAL
Basophils Absolute: 0 10*3/uL (ref 0.0–0.2)
Basos: 0 %
EOS (ABSOLUTE): 0.1 10*3/uL (ref 0.0–0.4)
Eos: 1 %
Hematocrit: 39.5 % (ref 34.0–46.6)
Hemoglobin: 12.9 g/dL (ref 11.1–15.9)
Immature Grans (Abs): 0 10*3/uL (ref 0.0–0.1)
Immature Granulocytes: 0 %
Lymphocytes Absolute: 2 10*3/uL (ref 0.7–3.1)
Lymphs: 26 %
MCH: 30.3 pg (ref 26.6–33.0)
MCHC: 32.7 g/dL (ref 31.5–35.7)
MCV: 93 fL (ref 79–97)
Monocytes Absolute: 0.4 10*3/uL (ref 0.1–0.9)
Monocytes: 5 %
Neutrophils Absolute: 5.1 10*3/uL (ref 1.4–7.0)
Neutrophils: 68 %
RBC: 4.26 x10E6/uL (ref 3.77–5.28)
RDW: 12.5 % (ref 11.7–15.4)
WBC: 7.6 10*3/uL (ref 3.4–10.8)

## 2022-01-24 LAB — TSH+FREE T4
Free T4: 1.07 ng/dL (ref 0.82–1.77)
TSH: 0.86 u[IU]/mL (ref 0.450–4.500)

## 2022-02-09 ENCOUNTER — Encounter: Payer: Self-pay | Admitting: Obstetrics

## 2022-02-09 ENCOUNTER — Ambulatory Visit (INDEPENDENT_AMBULATORY_CARE_PROVIDER_SITE_OTHER): Payer: Medicaid Other | Admitting: Obstetrics

## 2022-02-09 ENCOUNTER — Other Ambulatory Visit (HOSPITAL_COMMUNITY)
Admission: RE | Admit: 2022-02-09 | Discharge: 2022-02-09 | Disposition: A | Discharge status: Home / Self Care | Payer: Medicaid Other | Source: Ambulatory Visit | Attending: Obstetrics | Admitting: Obstetrics

## 2022-02-09 VITALS — BP 108/89 | HR 80 | Ht 68.0 in | Wt 130.0 lb

## 2022-02-09 DIAGNOSIS — Z113 Encounter for screening for infections with a predominantly sexual mode of transmission: Secondary | ICD-10-CM | POA: Insufficient documentation

## 2022-02-09 DIAGNOSIS — Z01419 Encounter for gynecological examination (general) (routine) without abnormal findings: Secondary | ICD-10-CM

## 2022-02-09 DIAGNOSIS — Z124 Encounter for screening for malignant neoplasm of cervix: Secondary | ICD-10-CM

## 2022-02-09 DIAGNOSIS — R768 Other specified abnormal immunological findings in serum: Secondary | ICD-10-CM

## 2022-02-09 NOTE — Progress Notes (Signed)
Gynecology Annual Exam  PCP: Gregary Signs, MD  Chief Complaint:  Chief Complaint  Patient presents with   Gynecologic Exam    No concerns    History of Present Illness:  [name] is a 21 y.o. G1P1001 who LMP was Patient's last menstrual period was 02/09/2022 (exact date)., presents today for her annual examination.  Her menses are regular every 28-30 days, lasting 6 day(s).  Dysmenorrhea none. She does not have intermenstrual bleeding.  She is single partner, contraception - IUD.  Last Pap: has never had (age)   Hx of STDs: none  There is no FH of breast cancer. There is no FH of ovarian cancer. The patient does not do self-breast exams.  Tobacco use: The patient denies current or previous tobacco use. Alcohol use: none Exercise: moderately active    The patient wears seatbelts: yes.   The patient reports that domestic violence in her life is absent.   Past Medical History:  Diagnosis Date   GERD (gastroesophageal reflux disease)    Headache     Past Surgical History:  Procedure Laterality Date   NO PAST SURGERIES      Prior to Admission medications   Medication Sig Start Date End Date Taking? Authorizing Provider  levonorgestrel (MIRENA) 20 MCG/DAY IUD 1 each by Intrauterine route once.   Yes [provider]  valACYclovir (VALTREX) 500 MG tablet Take 1 tablet (500 mg total) by mouth daily. 01/23/22  Yes Imagene Riches, CNM    No Known Allergies  Gynecologic History: Patient's last menstrual period was 02/09/2022 (exact date). History of abnormal pap smear: No History of STI: No   Obstetric History: G1P1001  Social History   Socioeconomic History   Marital status: Single    Spouse name: Not on file   Number of children: Not on file   Years of education: Not on file   Highest education level: Not on file  Occupational History   Not on file  Tobacco Use   Smoking status: Never   Smokeless tobacco: Never  Vaping Use   Vaping Use:  Every day  Substance and Sexual Activity   Alcohol use: No   Drug use: Never   Sexual activity: Yes    Birth control/protection: I.U.D.    Comment: Mirena  Other Topics Concern   Not on file  Social History Narrative   Right handed    Lives with family    Social Determinants of Health   Financial Resource Strain: Not on file  Food Insecurity: Not on file  Transportation Needs: Not on file  Physical Activity: Not on file  Stress: Not on file  Social Connections: Not on file  Intimate Partner Violence: Not on file    Family History  Problem Relation Age of Onset   Stomach cancer Paternal Grandmother 31    Review of Systems  Constitutional:  Positive for malaise/fatigue.  HENT: Negative.    Eyes: Negative.   Respiratory: Negative.    Cardiovascular: Negative.   Gastrointestinal: Negative.   Genitourinary: Negative.   Musculoskeletal: Negative.   Skin: Negative.   Neurological: Negative.   Endo/Heme/Allergies: Negative.   Psychiatric/Behavioral:  The patient is nervous/anxious.      Physical Exam BP 108/89   Pulse 80   Ht 5\' 8"  (1.727 m)   Wt 130 lb (59 kg)   LMP 02/09/2022 (Exact Date)   Breastfeeding No   BMI 19.77 kg/m    Physical Exam Constitutional:      Appearance:  Normal appearance. She is normal weight.  Genitourinary:     Vulva and rectum normal.     Genitourinary Comments: Nno external lesions or areas of irritation. Normal vaginal rugae. No obvious vaginal discharge. Aptima swab sent to check for any bacteria or yeast, STIs. Uterus is small, non enlarged, anteverted. Her IUD strings are visible.  Cardiovascular:     Rate and Rhythm: Normal rate and regular rhythm.     Pulses: Normal pulses.     Heart sounds: Normal heart sounds.  Pulmonary:     Effort: Pulmonary effort is normal.     Breath sounds: Normal breath sounds.  Abdominal:     General: Abdomen is flat.     Palpations: Abdomen is soft.  Musculoskeletal:        General: Normal  range of motion.     Cervical back: Normal range of motion and neck supple.  Neurological:     General: No focal deficit present.     Mental Status: She is alert and oriented to person, place, and time.  Skin:    General: Skin is warm and dry.  Psychiatric:     Comments: Her PHQ is 8; her GAD is 16. She has declined medication for anxiety.     Female chaperone present for pelvic and breast  portions of the physical exam  Results: AUDIT Questionnaire (screen for alcoholism): NA PHQ-9: 8 She does report some anxiety- she is not on any medication.   Assessment: 21 y.o. G37P1001 female here for routine annual gynecologic examination For first Pap smear. Had a baby in 2022 Plan: Problem List Items Addressed This Visit       Other   HSV-2 seropositive   Other Visit Diagnoses     Screen for STD (sexually transmitted disease)    -  Primary   Relevant Orders   Cervicovaginal ancillary only   Cervical cancer screening       Relevant Orders   Cytology - PAP     We will let her know results of her lab work today.  RTC in one year for next Annual PE.  Mirna Mires, CNM  02/09/2022 5:28 PM    Screening: -- Blood pressure screen normal -- Weight screening: normal -- Depression screening negative (PHQ-9) -- Nutrition: normal -- cholesterol screening: not due for screening -- osteoporosis screening: not due -- tobacco screening: not using -- alcohol screening: AUDIT questionnaire indicates low-risk usage. -- family history of breast cancer screening: done. not at high risk. -- no evidence of domestic violence or intimate partner violence. -- STD screening: gonorrhea/chlamydia NAAT collected -- pap smear collected per ASCCP guidelines -- flu vaccine  declined today -- HPV vaccination series:  did not receive this- may do this at next visit. RTC in one year or PRN.  Mirna Mires, CNM  02/09/2022 10:42 AM   02/09/2022 10:42 AM

## 2022-02-13 ENCOUNTER — Encounter: Payer: Self-pay | Admitting: Internal Medicine

## 2022-02-13 ENCOUNTER — Ambulatory Visit: Payer: Medicaid Other | Admitting: Internal Medicine

## 2022-02-13 VITALS — BP 110/69 | HR 87 | Ht 68.0 in | Wt 127.8 lb

## 2022-02-13 DIAGNOSIS — I951 Orthostatic hypotension: Secondary | ICD-10-CM

## 2022-02-13 DIAGNOSIS — R55 Syncope and collapse: Secondary | ICD-10-CM

## 2022-02-13 LAB — CERVICOVAGINAL ANCILLARY ONLY
Bacterial Vaginitis (gardnerella): POSITIVE — AB
Chlamydia: NEGATIVE
Comment: NEGATIVE
Comment: NEGATIVE
Comment: NEGATIVE
Comment: NORMAL
Neisseria Gonorrhea: NEGATIVE
Trichomonas: NEGATIVE

## 2022-02-13 LAB — CYTOLOGY - PAP: Diagnosis: NEGATIVE

## 2022-02-13 NOTE — Progress Notes (Signed)
Primary Physician/Referring:  Gregary Signs, MD  Patient ID: Valerie Conner, female    DOB: 08-07-2000, 21 y.o.   MRN: [id number]  Chief Complaint  Patient presents with  . Loss of Consciousness  . New Patient (Initial Visit)   HPI:    Anishka Alwan  is a 21 y.o. ***  Past Medical History:  Diagnosis Date  . GERD (gastroesophageal reflux disease)   . Headache    Past Surgical History:  Procedure Laterality Date  . NO PAST SURGERIES     Family History  Problem Relation Age of Onset  . Stomach cancer Paternal Grandmother 55    Social History   Tobacco Use  . Smoking status: Never  . Smokeless tobacco: Never  Substance Use Topics  . Alcohol use: No   Marital Status: Single  ROS  ***ROS Objective  Blood pressure 110/69, pulse 87, height 5\' 8"  (1.727 m), weight 127 lb 12.8 oz (58 kg), last menstrual period 02/09/2022, SpO2 100 %, not currently breastfeeding. Body mass index is 19.43 kg/m.     02/13/2022   10:17 AM 02/13/2022   10:16 AM 02/13/2022   10:15 AM  Vitals with BMI  Systolic [id number]  Diastolic 69 70 55  Pulse 87 67 54     ***Physical Exam  Medications and allergies  No Known Allergies   Medication list after today's encounter   Current Outpatient Medications:  .  acetaminophen (TYLENOL) 500 MG tablet, Take 500 mg by mouth every 6 (six) hours as needed., Disp: , Rfl:  .  levonorgestrel (MIRENA) 20 MCG/DAY IUD, 1 each by Intrauterine route once., Disp: , Rfl:  .  valACYclovir (VALTREX) 500 MG tablet, Take 1 tablet (500 mg total) by mouth daily., Disp: 30 tablet, Rfl: 11  Laboratory examination:   Lab Results  Component Value Date   NA 137 11/24/2019   K 4.3 11/24/2019   CO2 20 11/24/2019   GLUCOSE 77 11/24/2019   BUN 7 11/24/2019   CREATININE 0.52 (L) 11/24/2019   CALCIUM 9.4 11/24/2019   GFRNONAA 140 11/24/2019       Latest Ref Rng & Units 11/24/2019    9:09 AM 03/10/2016    7:36 PM  CMP  Glucose 65 - 99 mg/dL 77  93   BUN  6 - 20 mg/dL 7  9   Creatinine 0.57 - 1.00 mg/dL 0.52  0.56   Sodium 134 - 144 mmol/L 137  138   Potassium 3.5 - 5.2 mmol/L 4.3  3.9   Chloride 96 - 106 mmol/L 104  109   CO2 20 - 29 mmol/L 20  23   Calcium 8.7 - 10.2 mg/dL 9.4  9.3   Total Protein 6.0 - 8.5 g/dL 6.4    Total Bilirubin 0.0 - 1.2 mg/dL 0.4    Alkaline Phos 45 - 106 IU/L 52    AST 0 - 40 IU/L 13    ALT 0 - 32 IU/L 13        Latest Ref Rng & Units 01/23/2022   10:32 AM 06/04/2020    5:27 AM 06/02/2020    3:57 PM  CBC  WBC 3.4 - 10.8 x10E3/uL 7.6  10.9  11.8   Hemoglobin 11.1 - 15.9 g/dL 12.9  10.3  11.4   Hematocrit 34.0 - 46.6 % 39.5  32.1  34.5   Platelets 150 - 400 K/uL  218  223     Lipid Panel No results for input(s): "CHOL", "TRIG", "[place]", "VLDL", "HDL", "  CHOLHDL", "LDLDIRECT" in the last 8760 hours.  HEMOGLOBIN A1C No results found for: "HGBA1C", "MPG" TSH Recent Labs    01/23/22 1032  TSH 0.860    External labs:   ***  Radiology:    Cardiac Studies:   No results found for this or any previous visit from the past 1095 days.     No results found for this or any previous visit from the past 1095 days.   ***  EKG:   ***  ***  Assessment     ICD-10-CM   1. Syncope and collapse  R55 EKG 12-Lead       Orders Placed This Encounter  Procedures  . EKG 12-Lead    No orders of the defined types were placed in this encounter.   There are no discontinued medications.   Recommendations:   Sandrina Heaton is a 21 y.o.  ***    Clotilde Dieter, DO, [facility]  02/13/2022, 10:27 AM Office: [phone] Pager: [phone]

## 2022-02-14 DIAGNOSIS — R55 Syncope and collapse: Secondary | ICD-10-CM | POA: Insufficient documentation

## 2022-02-14 DIAGNOSIS — I951 Orthostatic hypotension: Secondary | ICD-10-CM | POA: Insufficient documentation

## 2022-04-20 ENCOUNTER — Ambulatory Visit: Payer: Self-pay | Admitting: Internal Medicine

## 2022-04-24 ENCOUNTER — Encounter: Payer: Self-pay | Admitting: Internal Medicine

## 2022-04-24 ENCOUNTER — Ambulatory Visit: Payer: Medicaid Other | Attending: Internal Medicine | Admitting: Internal Medicine

## 2022-04-24 VITALS — BP 108/71 | HR 80 | Ht 69.0 in | Wt 129.0 lb

## 2022-04-24 DIAGNOSIS — I951 Orthostatic hypotension: Secondary | ICD-10-CM

## 2022-04-24 DIAGNOSIS — R55 Syncope and collapse: Secondary | ICD-10-CM

## 2022-04-24 NOTE — Patient Instructions (Signed)
Medication Instructions:  Your physician recommends that you continue on your current medications as directed. Please refer to the Current Medication list given to you today.  *If you need a refill on your cardiac medications before your next appointment, please call your pharmacy*   Lab Work: None ordered If you have labs (blood work) drawn today and your tests are completely normal, you will receive your results only by: [place] (if you have MyChart) OR A paper copy in the mail If you have any lab test that is abnormal or we need to change your treatment, we will call you to review the results.   Testing/Procedures: None ordered   Follow-Up: At [facility], you and your health needs are our priority.  As part of our continuing mission to provide you with exceptional heart care, we have created designated Provider Care Teams.  These Care Teams include your primary Cardiologist (physician) and Advanced Practice Providers (APPs -  Physician Assistants and Nurse Practitioners) who all work together to provide you with the care you need, when you need it.  We recommend signing up for the patient portal called "MyChart".  Sign up information is provided on this After Visit Summary.  MyChart is used to connect with patients for Virtual Visits (Telemedicine).  Patients are able to view lab/test results, encounter notes, upcoming appointments, etc.  Non-urgent messages can be sent to your provider as well.   To learn more about what you can do with MyChart, go to [email or web address].    Your next appointment:   3 -4 month(s)  Provider:   Virl Axe, MD  Other Instructions Discussed salt substitutes with a  goal of 2-3 gms of Sodium or 5-7 gm of sodium Chloride daily.    Rehydration solutions include Liquid IV, NUUN, TriOral, Normralyte pedialyte advanced care and Banana Bags.    Salt tablets include plain salt tablets, SaltStick Vitassium, thermatabs amongst others.    The higher sodium concentration per serving on this list is normalyte about 800 mg of sodium per serving LMNT1000 mg and most recently of worried about Within You hydration formula also at 1000 mg  Salt supplements are best used with adjunctive sugar  Also discussed the role of compression wear including thigh sleeves and abdominal binders.  Calf compression is not specifically recommended.Marland Kitchen

## 2022-04-24 NOTE — Progress Notes (Signed)
ELECTROPHYSIOLOGY CONSULT NOTE  Patient ID: Valerie Conner, MRN: [id number], DOB/AGE: 22/24/2002 22 y.o. Admit date: (Not on file) Date of Consult: 04/24/2022  Primary Physician: Gregary Signs, MD Primary Cardiologist: new     Francina Beery is a 22 y.o. female who is being seen today for the evaluation of vasovagal syncope at the request of Dr. Cherylann Banas.    HPI Valerie Conner is a 22 y.o. female mother of a 90-year-old boy named Valerie Conner who has a history of syncope dating back to 2016.  It has been associated with a stereotypical prodrome including ringing in her ears followed by decreased hearing, vision flushing some diaphoresis.  MADIT these episodes that she is able to abort others are associated with syncope with the recovery phase notable for residual orthostatic intolerance, clamminess diaphoresis and pallor.  Triggered mostly by prolonged standing, she has orthostatic lightheadedness upon abrupt standing.  Some shower intolerance with violaceous hue of her lower extremities.  Symptoms worsened during the first trimester of her pregnancy and then improved.  Some marijuana and alcohol.  Moderate associated stress; more remote history of depression, and at that time associated with some thoughts of self-harm.  Not of late.  Hx o DATE TEST EF   10/21 Echo   60-65 %         Date Cr K Hgb  8/21 0.52 4.3    10/23   12.9      Past Medical History:  Diagnosis Date   GERD (gastroesophageal reflux disease)    Headache       Surgical History:  Past Surgical History:  Procedure Laterality Date   NO PAST SURGERIES       Home Meds: No outpatient medications have been marked as taking for the 04/24/22 encounter (Appointment) with Deboraha Sprang, MD.    Allergies: No Known Allergies  Social History   Socioeconomic History   Marital status: Single    Spouse name: Not on file   Number of children: Not on file   Years of education: Not on file   Highest  education level: Not on file  Occupational History   Not on file  Tobacco Use   Smoking status: Never   Smokeless tobacco: Never  Vaping Use   Vaping Use: Every day  Substance and Sexual Activity   Alcohol use: No   Drug use: Never   Sexual activity: Yes    Birth control/protection: I.U.D.    Comment: Mirena  Other Topics Concern   Not on file  Social History Narrative   Right handed    Lives with family    Social Determinants of Health   Financial Resource Strain: Not on file  Food Insecurity: Not on file  Transportation Needs: Not on file  Physical Activity: Not on file  Stress: Not on file  Social Connections: Not on file  Intimate Partner Violence: Not on file     Family History  Problem Relation Age of Onset   Stomach cancer Paternal Grandmother 36     ROS:  Please see the history of present illness.     All other systems reviewed and negative.    Physical Exam:  BP 108/71 (BP Location: Left Arm, Patient Position: Sitting, Cuff Size: Normal)   Pulse 80   Ht 5\' 9"  (1.753 m)   Wt 129 lb (58.5 kg)   SpO2 99%   BMI 19.05 kg/m   not currently breastfeeding. General: Well developed, well nourished female in no  acute distress. Head: Normocephalic, atraumatic, sclera non-icteric, no xanthomas, nares are without discharge. EENT: normal  Lymph Nodes:  none Neck: Negative for carotid bruits. JVD not elevated. Back:without scoliosis kyphosis  Lungs: Clear bilaterally to auscultation without wheezes, rales, or rhonchi. Breathing is unlabored. Heart: RRR with S1 S2. No murmur . No rubs, or gallops appreciated. Abdomen: Soft, non-tender, non-distended with normoactive bowel sounds. No hepatomegaly. No rebound/guarding. No obvious abdominal masses. Msk:  Strength and tone appear normal for age. Extremities: No clubbing or cyanosis. No  edema.  Distal pedal pulses are 2+ and equal bilaterally.  Arachnodactyly and joint hypermobility Skin: Warm and Dry Neuro: Alert and  oriented X 3. CN III-XII intact Grossly normal sensory and motor function . Psych:  Responds to questions appropriately with a normal affect.        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  We discussed the role of salt and water repletion, the importance of exercise, often needing to be started in the recumbent position, and the awareness of triggers and the role of ambient heat and dehydration  Discussed salt substitutes with a  goal of 2-3 gms of Sodium or 5-7 gm of sodium Chloride daily.  Rehydration solutions include Liquid IV, NUUN, TriOral, Normralyte pedialyte advanced care and Banana Bags.  Salt tablets include plain salt tablets, SaltStick Vitassium, thermatabs amongst others.   The higher sodium concentration per serving on this list is normalyte about 800 mg of sodium per serving LMNT1000 mg and most recently of worried about Within You hydration formula also at 1000 mg  Salt supplements are best used with adjunctive sugar  Also discussed the role of compression wear including thigh sleeves and abdominal binders.  Calf compression is not specifically recommended..  Also encouraged her to be attentive to her mental health challenges of being a mom working with this chronicity of this illness.  I mentioned but did not recommend at this juncture rec restoration place      Virl Axe

## 2022-04-25 NOTE — Addendum Note (Signed)
Addended by: Britt Bottom on: 04/25/2022 09:52 AM   Modules accepted: Orders

## 2022-04-26 ENCOUNTER — Encounter: Payer: Self-pay | Admitting: Obstetrics

## 2022-04-26 ENCOUNTER — Ambulatory Visit (INDEPENDENT_AMBULATORY_CARE_PROVIDER_SITE_OTHER): Payer: Self-pay | Admitting: Obstetrics

## 2022-04-26 VITALS — BP 98/67 | HR 92 | Resp 15 | Wt 126.4 lb

## 2022-04-26 DIAGNOSIS — N6310 Unspecified lump in the right breast, unspecified quadrant: Secondary | ICD-10-CM

## 2022-04-26 DIAGNOSIS — N63 Unspecified lump in unspecified breast: Secondary | ICD-10-CM

## 2022-04-26 NOTE — Progress Notes (Signed)
Obstetrics & Gynecology Office Visit   Chief Complaint:  Chief Complaint  Patient presents with   Breast Problem    History of Present Illness: Valerie Conner  presents complaining of a lump she has found in her right breast. She noticed this two days ago, and also c/o some pain located on the medial side of the breast. She is midcycle. No nipple discharge noted.   Review of Systems:  Review of Systems  Constitutional: Negative.   Eyes: Negative.   Respiratory: Negative.    Cardiovascular: Negative.   Gastrointestinal: Negative.   Genitourinary: Negative.   All other systems reviewed and are negative.    Past Medical History:  Past Medical History:  Diagnosis Date   GERD (gastroesophageal reflux disease)    Headache     Past Surgical History:  Past Surgical History:  Procedure Laterality Date   NO PAST SURGERIES      Gynecologic History: Patient's last menstrual period was 04/06/2022 (exact date).  Obstetric History: G1P1001  Family History:  Family History  Problem Relation Age of Onset   Stomach cancer Paternal Grandmother 58    Social History:  Social History   Socioeconomic History   Marital status: Single    Spouse name: Not on file   Number of children: Not on file   Years of education: Not on file   Highest education level: Not on file  Occupational History   Not on file  Tobacco Use   Smoking status: Never   Smokeless tobacco: Never  Vaping Use   Vaping Use: Every day  Substance and Sexual Activity   Alcohol use: No   Drug use: Never   Sexual activity: Yes    Birth control/protection: I.U.D.    Comment: Mirena  Other Topics Concern   Not on file  Social History Narrative   Right handed    Lives with family    Social Determinants of Health   Financial Resource Strain: Not on file  Food Insecurity: Not on file  Transportation Needs: Not on file  Physical Activity: Not on file  Stress: Not on file  Social Connections: Not on file   Intimate Partner Violence: Not on file    Allergies:  No Known Allergies  Medications: Prior to Admission medications   Medication Sig Start Date End Date Taking? Authorizing Provider  acetaminophen (TYLENOL) 500 MG tablet Take 500 mg by mouth every 6 (six) hours as needed.   Yes [provider]  Cholecalciferol (VITAMIN D3) 1.25 MG (50000 UT) CAPS Take 1 capsule by mouth once a week. 03/14/22  Yes [provider]  levonorgestrel (MIRENA) 20 MCG/DAY IUD 1 each by Intrauterine route once.   Yes [provider]  valACYclovir (VALTREX) 500 MG tablet Take 1 tablet (500 mg total) by mouth daily. 01/23/22  Yes Imagene Riches, CNM    Physical Exam Vitals:  Vitals:   04/26/22 0933  BP: 98/67  Pulse: 92  Resp: 15   Patient's last menstrual period was 04/06/2022 (exact date).  Physical Exam Vitals reviewed.  Constitutional:      Appearance: Normal appearance.     Comments: BMI is 18.  Cardiovascular:     Rate and Rhythm: Normal rate and regular rhythm.  Pulmonary:     Effort: Pulmonary effort is normal.     Breath sounds: Normal breath sounds.  Chest:       Comments: Palpable breast mass noted- may be mammary tissue as there is limited mammary tissue on  either breast. No nipple discharge. The mass is mobile and irregular in shape- sl tender Abdominal:     General: Abdomen is flat.     Palpations: Abdomen is soft.  Genitourinary:    Comments: deferred Musculoskeletal:     Cervical back: Normal range of motion and neck supple.  Neurological:     Mental Status: She is alert.      Assessment: 22 y.o. G1P1001  with palpable breast "enlargement" on right breast   Plan: Problem List Items Addressed This Visit   None Visit Diagnoses     Breast lump in female    -  Primary     Will order a diagnostic mammogram to be done. Careful explanation to patient provided. Follow up per the mammogram results. Much reassurance provided to  patient.  Imagene Riches, CNM  04/26/2022 10:26 AM

## 2022-04-27 ENCOUNTER — Other Ambulatory Visit: Payer: Self-pay | Admitting: Obstetrics

## 2022-04-27 DIAGNOSIS — N6310 Unspecified lump in the right breast, unspecified quadrant: Secondary | ICD-10-CM

## 2022-05-09 ENCOUNTER — Ambulatory Visit
Admission: RE | Admit: 2022-05-09 | Discharge: 2022-05-09 | Disposition: A | Discharge status: Home / Self Care | Payer: Self-pay | Source: Ambulatory Visit | Attending: Obstetrics | Admitting: Obstetrics

## 2022-05-09 DIAGNOSIS — N6310 Unspecified lump in the right breast, unspecified quadrant: Secondary | ICD-10-CM | POA: Insufficient documentation

## 2022-05-25 NOTE — Progress Notes (Signed)
Obstetrics & Gynecology Office Visit   Chief Complaint:  Chief Complaint  Patient presents with   Follow-up    Pt has been passing out and wondering if its "from hormones"? Has cycles with IUD. Has seen neurologist for it in the past, with no help.     History of Present Illness:  Valerie Conner presents today with c/o some fatigue and recently several episodes of dizziness, and she has fainted. She would like some testing.  She also asks for a renewal of her Valtrex.   Review of Systems:  Review of Systems  Constitutional:  Positive for malaise/fatigue.  HENT: Negative.    Eyes: Negative.   Respiratory: Negative.    Cardiovascular: Negative.   Genitourinary: Negative.   Musculoskeletal: Negative.   Skin: Negative.   Neurological:  Positive for dizziness.       Reports fainting recently.  Endo/Heme/Allergies: Negative.   Psychiatric/Behavioral: Negative.       Past Medical History:  Past Medical History:  Diagnosis Date   GERD (gastroesophageal reflux disease)    Headache     Past Surgical History:  Past Surgical History:  Procedure Laterality Date   NO PAST SURGERIES      Gynecologic History: Patient's last menstrual period was 01/15/2022 (exact date).  Obstetric History: G1P1001  Family History:  Family History  Problem Relation Age of Onset   Stomach cancer Paternal Grandmother 38    Social History:  Social History   Socioeconomic History   Marital status: Single    Spouse name: Not on file   Number of children: Not on file   Years of education: Not on file   Highest education level: Not on file  Occupational History   Not on file  Tobacco Use   Smoking status: Never   Smokeless tobacco: Never  Vaping Use   Vaping Use: Every day  Substance and Sexual Activity   Alcohol use: No   Drug use: Never   Sexual activity: Yes    Birth control/protection: I.U.D.    Comment: Mirena  Other Topics Concern   Not on file  Social History Narrative    Right handed    Lives with family    Social Determinants of Health   Financial Resource Strain: Not on file  Food Insecurity: Not on file  Transportation Needs: Not on file  Physical Activity: Not on file  Stress: Not on file  Social Connections: Not on file  Intimate Partner Violence: Not on file    Allergies:  No Known Allergies  Medications: Prior to Admission medications   Medication Sig Start Date End Date Taking? Authorizing Provider  valACYclovir (VALTREX) 500 MG tablet Take 1 tablet (500 mg total) by mouth daily. 01/23/22  Yes Imagene Riches, CNM  acetaminophen (TYLENOL) 500 MG tablet Take 500 mg by mouth every 6 (six) hours as needed.    [provider]  Cholecalciferol (VITAMIN D3) 1.25 MG (50000 UT) CAPS Take 1 capsule by mouth once a week. 03/14/22   [provider]  levonorgestrel (MIRENA) 20 MCG/DAY IUD 1 each by Intrauterine route once.    [provider]    Physical Exam Vitals:  Vitals:   01/23/22 0925  BP: 108/72  Pulse: 86   Patient's last menstrual period was 01/15/2022 (exact date).  Physical Exam Constitutional:      Appearance: Normal appearance. She is normal weight.  HENT:     Head: Normocephalic and atraumatic.  Cardiovascular:     Rate and  Rhythm: Normal rate and regular rhythm.     Pulses: Normal pulses.     Heart sounds: Normal heart sounds.  Pulmonary:     Effort: Pulmonary effort is normal.     Breath sounds: Normal breath sounds.  Abdominal:     Palpations: Abdomen is soft.  Genitourinary:    Comments: deferred Musculoskeletal:     Cervical back: Normal range of motion and neck supple.  Skin:    General: Skin is warm and dry.  Neurological:     General: No focal deficit present.     Mental Status: She is alert and oriented to person, place, and time.  Psychiatric:        Mood and Affect: Mood normal.        Behavior: Behavior normal.      Assessment: 22 y.o. G1P1001 with recent episodes of  dizziness, feeling faint.  Plan: Problem List Items Addressed This Visit       Other   Dizziness   Other Visit Diagnoses     Chronic fatigue    -  Primary   Relevant Orders   TSH + free T4 (Completed)   Women's annual routine gynecological examination       Cervical cancer screening       Fatigue associated with anemia       Relevant Orders   CBC With Differential (Completed)   HSV infection       Relevant Medications   valACYclovir (VALTREX) 500 MG tablet     We have ordered her some labs: TSH and T4, CBC. I have renewed her Valtrex. We will treat based on the test resultsd. She may also f/u with a PCP.  Imagene Riches, CNM  05/25/2022 3:42 PM

## 2022-07-31 DIAGNOSIS — M2480 Other specific joint derangements of unspecified joint, not elsewhere classified: Secondary | ICD-10-CM | POA: Insufficient documentation

## 2022-08-03 ENCOUNTER — Ambulatory Visit: Payer: Medicaid Other | Admitting: Internal Medicine

## 2022-09-14 ENCOUNTER — Other Ambulatory Visit: Payer: Self-pay

## 2022-09-14 DIAGNOSIS — B009 Herpesviral infection, unspecified: Secondary | ICD-10-CM

## 2022-09-14 MED ORDER — VALACYCLOVIR HCL 500 MG PO TABS: 500.0000 mg | ORAL_TABLET | Freq: Every day | ORAL | 0 refills | Status: DC

## 2022-10-03 ENCOUNTER — Ambulatory Visit: Payer: Medicaid Other | Attending: Internal Medicine | Admitting: Internal Medicine

## 2022-10-03 ENCOUNTER — Encounter: Payer: Self-pay | Admitting: Internal Medicine

## 2022-10-03 VITALS — BP 106/68 | HR 60 | Ht 69.0 in | Wt 128.0 lb

## 2022-10-03 DIAGNOSIS — R55 Syncope and collapse: Secondary | ICD-10-CM | POA: Diagnosis not present

## 2022-10-03 DIAGNOSIS — I951 Orthostatic hypotension: Secondary | ICD-10-CM

## 2022-10-03 NOTE — Patient Instructions (Signed)
Medication Instructions:  Your physician recommends that you continue on your current medications as directed. Please refer to the Current Medication list given to you today.  *If you need a refill on your cardiac medications before your next appointment, please call your pharmacy*  Follow-Up: At [facility], you and your health needs are our priority.  As part of our continuing mission to provide you with exceptional heart care, we have created designated Provider Care Teams.  These Care Teams include your primary Cardiologist (physician) and Advanced Practice Providers (APPs -  Physician Assistants and Nurse Practitioners) who all work together to provide you with the care you need, when you need it.  Your next appointment:   1 year(s)  Provider:   Sherryl Manges, MD    Other Instructions Recommended salt supplementation.  The goal is about 7-10 g of sodium, not sodium chloride, a day.  Salt supplements include oral ThermaTabs, buffered sodium preparation, SaltStick Vitassium,  Other options include NUUN.  This comes in pill form and is dissolved in liquids.  Other liquid preparations include liquid IV, Pedialyte advance care, TRI-oral Also discussed the role of compression wear including thigh sleeves and abdominal binders.  Calf compression is not specifically recommended.Marland Kitchen

## 2022-10-03 NOTE — Progress Notes (Signed)
Patient Care Team: Erick Colace, MD as PCP - General (Pediatrics) Debbe Odea, MD as PCP - Cardiology (Cardiology) Van Clines, MD as Consulting Physician (Neurology)   HPI  Valerie Conner is a 22 y.o. female seen in followup for Neurocardiogenic syncope, orthostatic intolerance in the setting of joint hypermobility and arachnodactyly.  She reminds me that she is a mother of a 60-year-old named Doctor, general practice and she is working at [employer].  Symptoms are considerably improved on salt and water repletion.  Salt repletion is about 1.5 g.  Fluid status is improved.  Has some orthostatic lightheadedness but has had no syncope.  Exercise tolerance is also better.  Menses are stable with the implant and symptoms are not particularly aggravated by this.  Alcohol and marijuana use are significantly reduced   Records and Results Reviewed   Past Medical History:  Diagnosis Date   GERD (gastroesophageal reflux disease)    Headache     Past Surgical History:  Procedure Laterality Date   NO PAST SURGERIES      Current Meds  Medication Sig   acetaminophen (TYLENOL) 500 MG tablet Take 500 mg by mouth every 6 (six) hours as needed.   Cholecalciferol (VITAMIN D3) 1.25 MG (50000 UT) CAPS Take 1 capsule by mouth once a week.   levonorgestrel (MIRENA) 20 MCG/DAY IUD 1 each by Intrauterine route once.   valACYclovir (VALTREX) 500 MG tablet Take 1 tablet (500 mg total) by mouth daily.    No Known Allergies    Review of Systems negative except from HPI and PMH  Physical Exam BP 106/68   Pulse 60   Ht 5\' 9"  (1.753 m)   Wt 128 lb (58.1 kg)   SpO2 98%   BMI 18.90 kg/m  Well developed and well nourished in no acute distress HENT normal E scleral and icterus clear Neck Supple JVP flat; carotids brisk and full Clear to ausculation  Regular rate and rhythm, no murmurs gallops or rub Soft with active bowel sounds No clubbing cyanosis  Edema Alert and oriented,  grossly normal motor and sensory function Skin Warm and Dry  ECG sinus at 60 Interval 17/09/41  CrCl cannot be calculated (Patient's most recent lab result is older than the maximum 21 days allowed.).   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  We discussed extensively the issues of dysautonomia, the physiology of orthstasis and positional stress.  We discussed the role of salt and water repletion, the importance of exercise, often needing to be started in the recumbent position, and the awareness of triggers and the role of ambient heat and dehydration   Discussed salt substitutes with a  goal of 3-4 gms of Sodium or 12-15 gm of sodium Chloride daily.  Rehydration solutions include Liquid IV, NUUN, TriOral, Normralyte pedialyte advanced care and Banana Bags.  Salt tablets include plain salt tablets, SaltStick Vitassium, thermatabs amongst others.   The higher sodium concentration per serving on this list is normalyte about 800 mg of sodium per serving LMNT1000 mg and most recently of worried about Within You hydration formula also at 1000 mg  Salt supplements are best used with adjunctive sugar  Also discussed the role of compression wear including thigh sleeves and abdominal binders.  Calf compression is not specifically recommended..  She mentioned her sister is having symptoms as  well.   Current medicines are reviewed at length with the patient today .  The patient does not  have concerns regarding medicines.

## 2023-01-19 ENCOUNTER — Telehealth: Payer: Self-pay | Admitting: Obstetrics

## 2023-01-19 NOTE — Telephone Encounter (Signed)
LM for patient to call office back to schedule annual appt with MMF after 02/10/23

## 2023-07-27 NOTE — Progress Notes (Addendum)
 Subjective Patient ID: Valerie Conner is a 23 y.o. female.  Valerie Conner is a 23 y.o. female presents for concerns of tick head remaining in skin after she attempted to remove 11 days ago.  Located on abdomen and a small black spot with some surrounding erythema.   History provided by:  Patient Language interpreter used: No     Review of Systems  Skin:  Positive for wound.    Patient History  Allergies: No Known Allergies   History reviewed. No pertinent past medical history. History reviewed. No pertinent surgical history. Social History   Socioeconomic History  . Marital status: Not on file    Spouse name: Not on file  . Number of children: Not on file  . Years of education: Not on file  . Highest education level: Not on file  Occupational History  . Not on file  Tobacco Use  . Smoking status: Never  . Smokeless tobacco: Never  Substance and Sexual Activity  . Alcohol use: Not on file  . Drug use: Not on file  . Sexual activity: Not on file  Other Topics Concern  . Not on file  Social History Narrative  . Not on file   History reviewed. No pertinent family history. Current Outpatient Medications on File Prior to Visit  Medication Sig Dispense Refill  . acetaminophen  (Tylenol ) 500 MG tablet Take 500 mg by mouth. (Patient not taking: Reported on 07/27/2023)    . Levonorgestrel  20 MCG/DAY intrauterine device 1 each by Intrauterine route. (Patient not taking: Reported on 07/27/2023)    . valACYclovir  (Valtrex ) 1 g tablet TAKE 1 TABLET (1,000 MG TOTAL) BY MOUTH 2 (TWO) TIMES DAILY FOR 10 DAYS. (Patient not taking: Reported on 07/27/2023)     No current facility-administered medications on file prior to visit.     Objective  Vitals:   07/27/23 1517  BP: 110/72  Pulse: 64  Resp: 18  Temp: 36.5 C (97.7 F)  SpO2: 99%  Weight: 55.3 kg  Height: 5' 6  PainSc: 0-No pain  LMP: 07/01/2023                No results found.  Physical Exam Vitals and  nursing note reviewed.  Constitutional:      General: She is not in acute distress.    Appearance: Normal appearance.  HENT:     Head: Normocephalic.     Nose: Nose normal.     Mouth/Throat:     Mouth: Mucous membranes are moist.  Eyes:     Extraocular Movements: Extraocular movements intact.     Conjunctiva/sclera: Conjunctivae normal.  Cardiovascular:     Rate and Rhythm: Normal rate.  Pulmonary:     Effort: Pulmonary effort is normal.  Abdominal:     General: There is no distension.  Musculoskeletal:        General: Normal range of motion.     Cervical back: Normal range of motion.  Skin:    General: Skin is warm.     Findings: Rash present.     Comments: Small dark-colored speck with area of surrounding erythema and elevation.  Neurological:     Mental Status: She is alert and oriented to person, place, and time.  Psychiatric:        Mood and Affect: Mood normal.        Behavior: Behavior normal.      Results for orders placed or performed in visit on 07/27/23  Foreign Body - Embedded   Narrative  Debby Rung, [place]     07/27/2023  4:15 PM Foreign Body - Embedded  Date/Time: 07/27/2023 3:15 PM  Performed by: Debby Rung, PA Authorized by: Debby Rung, PA   Consent:    Consent obtained:  Verbal   Consent given by:  Patient   Risks, benefits, and alternatives were discussed: yes     Risks discussed:  Bleeding, infection, pain, worsening of condition,  poor cosmetic result, nerve damage and incomplete removal   Alternatives discussed:  No treatment, alternative treatment,  observation, referral and delayed treatment Universal protocol:    Procedure explained and questions answered to patient or proxy's  satisfaction: yes     Patient identity confirmed:  Verbally with patient Location:    Location:  Trunk   Trunk location:  LLQ abd   Depth:  Intradermal   Tendon involvement:  None Pre-procedure details:    Imaging:  None   Neurovascular status:  intact   Anesthesia:    Anesthesia method:  None Procedure type:    Procedure complexity:  Simple Procedure details:    Localization method:  Finder needle   Dissection of underlying tissues: no     Bloodless field: yes     Removal mechanism:  Forceps (18 Gauge needle tip)   Foreign bodies recovered:  2   Description:  Tick head and hair   Intact foreign body removal: yes   Post-procedure details:    Neurovascular status: intact     Confirmation:  No additional foreign bodies on visualization   Skin closure:  None   Dressing:  Adhesive bandage and antibiotic ointment   Procedure completion:  Tolerated well, no immediate complications     Foreign Body - Embedded  Date/Time: 07/27/2023 3:15 PM  Performed by: Debby Rung, PA Authorized by: Debby Rung, PA   Consent:    Consent obtained:  Verbal   Consent given by:  Patient   Risks, benefits, and alternatives were discussed: yes     Risks discussed:  Bleeding, infection, pain, worsening of condition, poor cosmetic result, nerve damage and incomplete removal   Alternatives discussed:  No treatment, alternative treatment, observation, referral and delayed treatment Universal protocol:    Procedure explained and questions answered to patient or proxy's satisfaction: yes     Patient identity confirmed:  Verbally with patient Location:    Location:  Trunk   Trunk location:  LLQ abd   Depth:  Intradermal   Tendon involvement:  None Pre-procedure details:    Imaging:  None   Neurovascular status: intact   Anesthesia:    Anesthesia method:  None Procedure type:    Procedure complexity:  Simple Procedure details:    Localization method:  Finder needle   Dissection of underlying tissues: no     Bloodless field: yes     Removal mechanism:  Forceps (18 Gauge needle tip)   Foreign bodies recovered:  2   Description:  Tick head and hair   Intact foreign body removal: yes   Post-procedure details:    Neurovascular status:  intact     Confirmation:  No additional foreign bodies on visualization   Skin closure:  None   Dressing:  Adhesive bandage and antibiotic ointment   Procedure completion:  Tolerated well, no immediate complications  MDM:     1 Acute complicated illness or injury     Explanation of Medical Decision Making and variances from expected care:  Complicated foreign body removal requiring additional time with MAC by glass to safely  remove without damage to surrounding tissues.   Patient is unsure how long tick was in place although risk factors for Lyme particularly low patient concerned and request to do coverage.  Antibiotic risks explained patient is willing to accept all risks.  Low concern for potential skin infection as well so antibiotic coverage reasonable.   Assessment requiring historian other than patient: No     Independent visualization of image, tracing, or test: No     Discussion of management with another provider: No     Risk:: Moderate          Assessment/Plan Diagnoses and all orders for this visit:  Tick bite with subsequent removal of tick -     doxycycline (Vibramycin) 100 MG capsule; Take 1 capsule (100 mg total) by mouth in the morning and 1 capsule (100 mg total) in the evening. Do all this for 10 days. Take with at least 8 ounces (large glass) of water, do not lie down for 30 minutes after. -     Foreign Body - Embedded      Disposition Status: Home  Progress note signed by Debby Rung, PA on 07/27/23 at  4:16 PM

## 2024-01-02 ENCOUNTER — Encounter: Payer: Self-pay | Admitting: Obstetrics

## 2024-01-02 ENCOUNTER — Ambulatory Visit: Admitting: Obstetrics

## 2024-01-02 VITALS — BP 101/57 | HR 69 | Wt 169.0 lb

## 2024-01-02 DIAGNOSIS — B009 Herpesviral infection, unspecified: Secondary | ICD-10-CM | POA: Diagnosis not present

## 2024-01-02 DIAGNOSIS — N898 Other specified noninflammatory disorders of vagina: Secondary | ICD-10-CM | POA: Diagnosis not present

## 2024-01-02 LAB — POCT URINALYSIS DIPSTICK
Bilirubin, UA: NEGATIVE
Blood, UA: NEGATIVE
Glucose, UA: NEGATIVE
Ketones, UA: NEGATIVE
Leukocytes, UA: NEGATIVE
Nitrite, UA: NEGATIVE
Protein, UA: NEGATIVE
Spec Grav, UA: 1.02 (ref 1.010–1.025)
Urobilinogen, UA: 0.2 U/dL
pH, UA: 6.5 (ref 5.0–8.0)

## 2024-01-02 MED ORDER — VALACYCLOVIR HCL 500 MG PO TABS
500.0000 mg | ORAL_TABLET | Freq: Every day | ORAL | 3 refills | Status: AC
Start: 2024-01-02 — End: ?

## 2024-01-02 NOTE — Progress Notes (Signed)
 error

## 2024-01-02 NOTE — Progress Notes (Signed)
   GYN ENCOUNTER  Subjective  HPI: Valerie Conner is a 23 y.o. G1P1001 who presents today for vaginal odor that has been ongoing since June 2024. She denies itching and abnormal discharge. She has been with her current partner since January, and he has had negative STI screening. She was last with her prior partner in June 2024 but does not know what his STI status was. She has not had any testing in that time.  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 They currently use condoms regularly.   Past Medical History:  Diagnosis Date   GERD (gastroesophageal reflux disease)    Headache    Past Surgical History:  Procedure Laterality Date   NO PAST SURGERIES     OB History     Gravida  1   Para  1   Term  1   Preterm      AB      Living  1      SAB      IAB      Ectopic      Multiple  0   Live Births  1          No Known Allergies  ROS: See HPI   Objective  Wt 169 lb (76.7 kg)   BMI 24.96 kg/m   Physical examination General: Alert, cooperative, NAD Pelvic: declines  Assessment -Vaginal odor -HSV2  Plan -Swab collected. F/u based on results. Discussed possible treatment. Recommend boric acid PV x 3 weeks and then PRN if swab is negative. Reviewed ways to reduce BV. -Discussed daily suppressive therapy, condom use, and avoiding intercourse during an outbreak. Rx sent for valacyclovir  500 mg PO daily.   Stella Bortle, CNM

## 2024-01-05 LAB — NUSWAB VG PLUS+MYCOPLASMAS,NAA
Atopobium vaginae: HIGH {score} — AB
BVAB 2: HIGH {score} — AB
Candida albicans, NAA: NEGATIVE
Candida glabrata, NAA: NEGATIVE
Chlamydia trachomatis, NAA: POSITIVE — AB
Megasphaera 1: HIGH {score} — AB
Mycoplasma genitalium NAA: NEGATIVE
Mycoplasma hominis NAA: POSITIVE — AB
Neisseria gonorrhoeae, NAA: NEGATIVE
Trich vag by NAA: NEGATIVE
Ureaplasma spp NAA: POSITIVE — AB

## 2024-01-06 ENCOUNTER — Other Ambulatory Visit: Payer: Self-pay | Admitting: Obstetrics

## 2024-01-06 ENCOUNTER — Encounter: Payer: Self-pay | Admitting: Obstetrics

## 2024-01-06 DIAGNOSIS — A749 Chlamydial infection, unspecified: Secondary | ICD-10-CM | POA: Insufficient documentation

## 2024-01-06 MED ORDER — FLUCONAZOLE 150 MG PO TABS: 150.0000 mg | ORAL_TABLET | Freq: Once | ORAL | 0 refills | Status: AC

## 2024-01-06 MED ORDER — METRONIDAZOLE 0.75 % VA GEL: 1.0000 | Freq: Every day | VAGINAL | 1 refills | Status: AC

## 2024-01-06 MED ORDER — DOXYCYCLINE HYCLATE 100 MG PO CAPS: 100.0000 mg | ORAL_CAPSULE | Freq: Two times a day (BID) | ORAL | 0 refills | Status: AC
